# Patient Record
Sex: Male | Born: 1985
Health system: Southern US, Community
[De-identification: ages and names within clinical notes are randomized; demographics above are authoritative.]

## PROBLEM LIST (undated history)

## (undated) DIAGNOSIS — Z77098 Contact with and (suspected) exposure to other hazardous, chiefly nonmedicinal, chemicals: Secondary | ICD-10-CM

## (undated) DIAGNOSIS — J329 Chronic sinusitis, unspecified: Secondary | ICD-10-CM

## (undated) HISTORY — PX: OTHER SURGICAL HISTORY: SHX169

## (undated) HISTORY — DX: Chronic sinusitis, unspecified: J32.9

## (undated) HISTORY — PX: UPPER GI ENDOSCOPY: SHX6162

## (undated) HISTORY — DX: Contact with and (suspected) exposure to other hazardous, chiefly nonmedicinal, chemicals: Z77.098

---
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
## 2009-05-13 ENCOUNTER — Emergency Department: Payer: Self-pay | Admitting: Internal Medicine

## 2009-08-01 ENCOUNTER — Emergency Department: Payer: Self-pay | Admitting: Internal Medicine

## 2009-08-11 ENCOUNTER — Emergency Department: Payer: Self-pay | Admitting: Emergency Medicine

## 2009-10-10 ENCOUNTER — Emergency Department: Payer: Self-pay | Admitting: Emergency Medicine

## 2009-10-21 ENCOUNTER — Emergency Department: Payer: Self-pay | Admitting: Emergency Medicine

## 2014-02-18 ENCOUNTER — Ambulatory Visit: Payer: Self-pay | Admitting: Urology

## 2014-03-15 DIAGNOSIS — M545 Low back pain, unspecified: Secondary | ICD-10-CM | POA: Insufficient documentation

## 2015-07-31 IMAGING — CT CT ABD-PELV W/O CM
2 of 4 series · 16 of 46 positions shown, 18 images · non-contrast
Comparison: None.

CLINICAL DATA: Right-sided flank pain, dysuria

EXAM:
CT ABDOMEN AND PELVIS WITHOUT CONTRAST
TECHNIQUE: Multidetector CT imaging of the abdomen and pelvis was performed
following the standard protocol without intravenous contrast.

[Series 2: stone · axial · 0.66mm/px · z∈[-810,-450]mm · 13 of 80 slices shown, 15 images]
[im 4/80  soft-tissue]
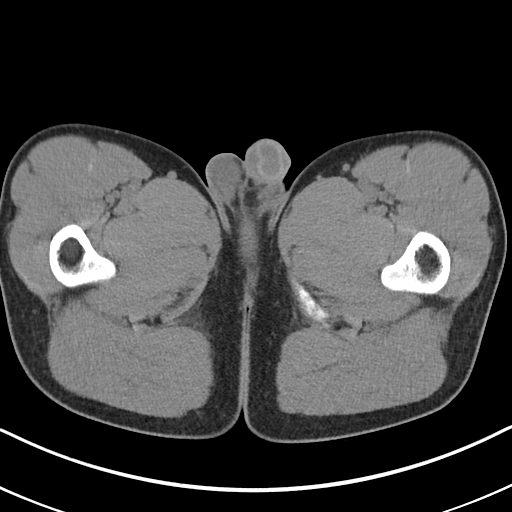
[im 4/80  bone]
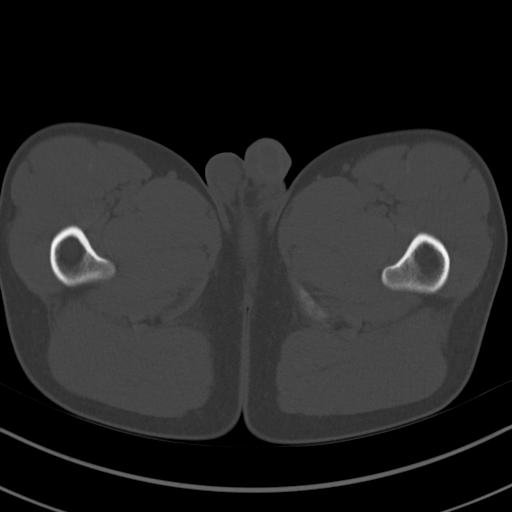
[im 10/80  soft-tissue]
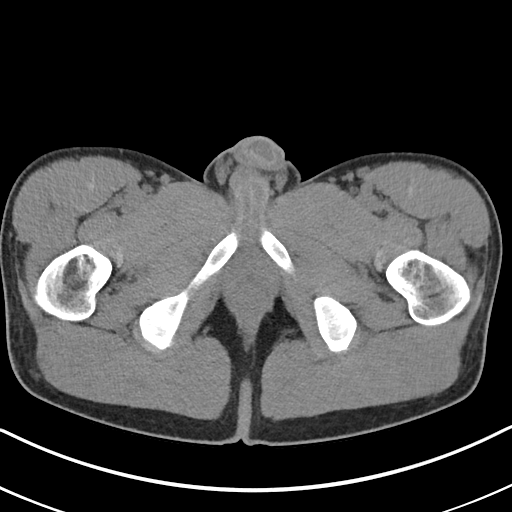
[im 16/80  soft-tissue]
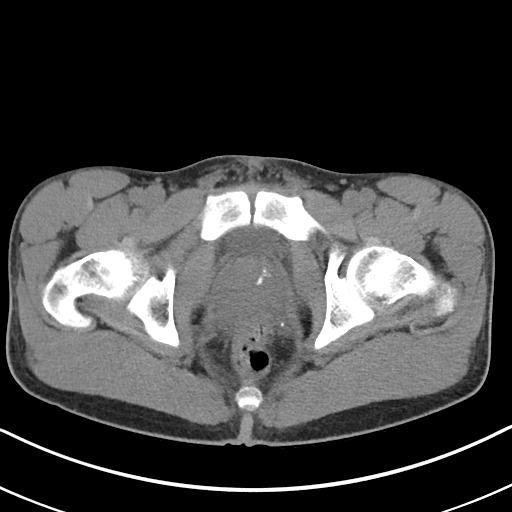
[im 23/80  soft-tissue]
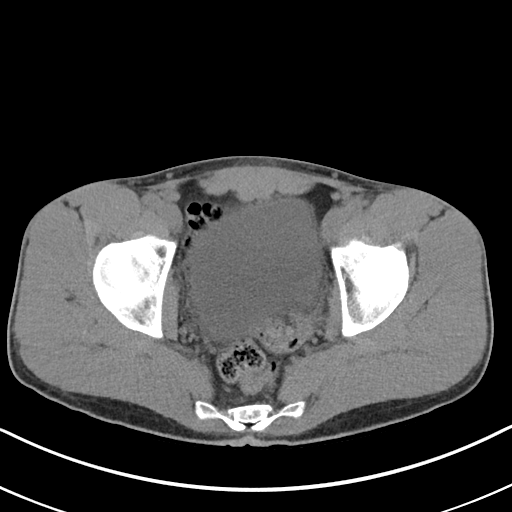
[im 29/80  soft-tissue]
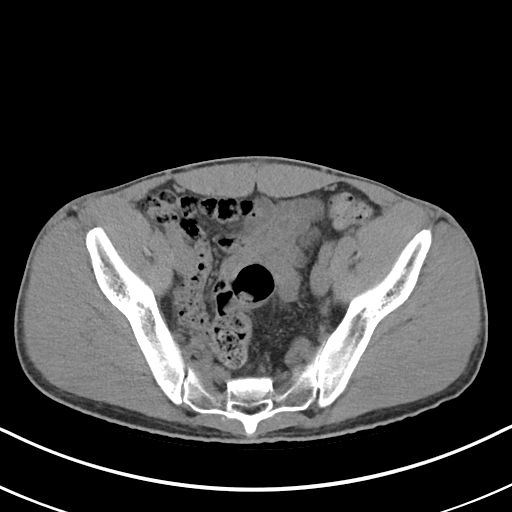
[im 35/80  soft-tissue]
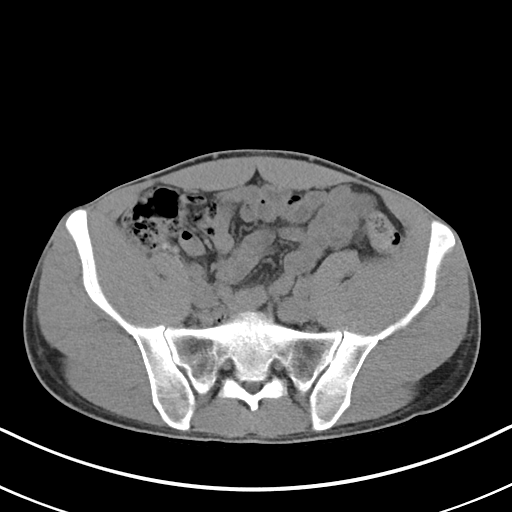
[im 42/80  soft-tissue]
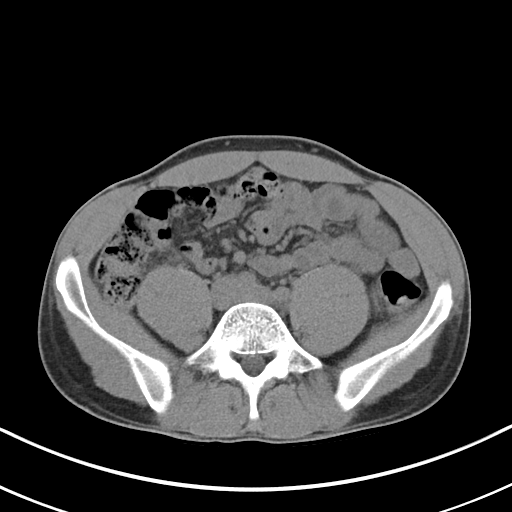
[im 45/80  soft-tissue]
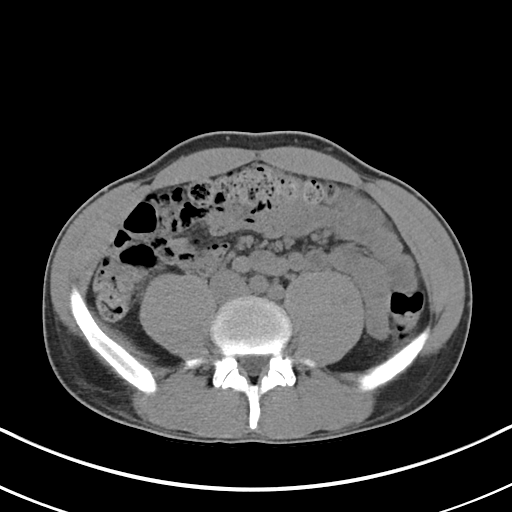
[im 51/80  soft-tissue]
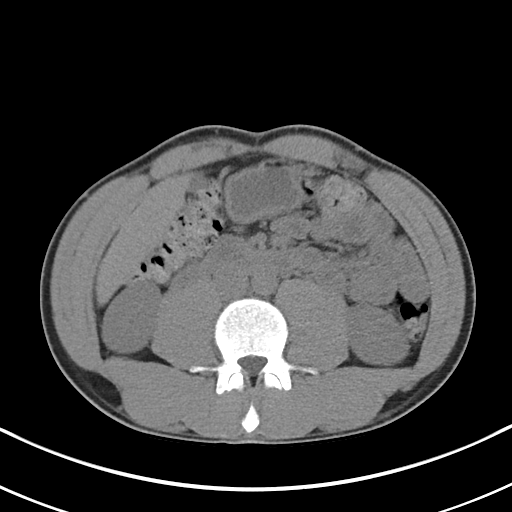
[im 51/80  bone]
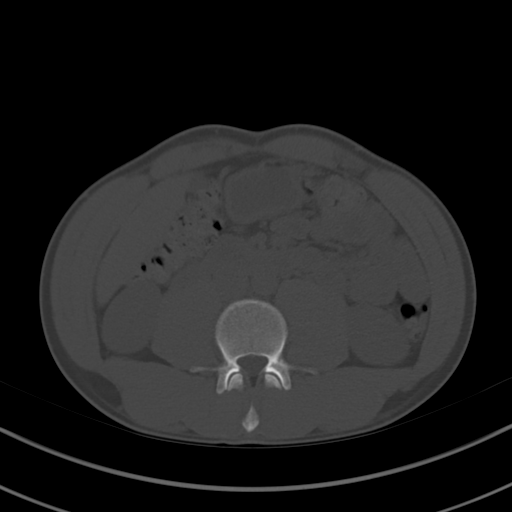
[im 57/80  soft-tissue]
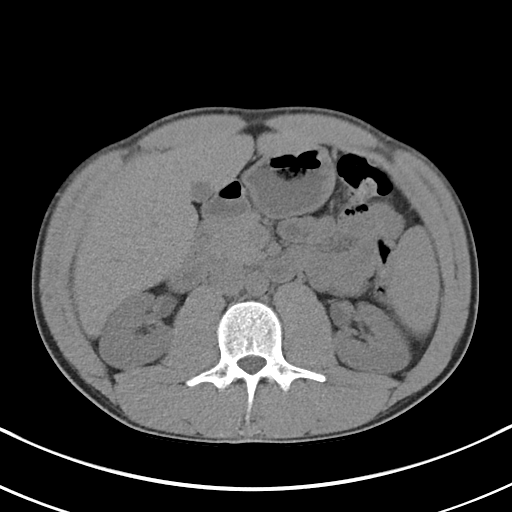
[im 64/80  soft-tissue]
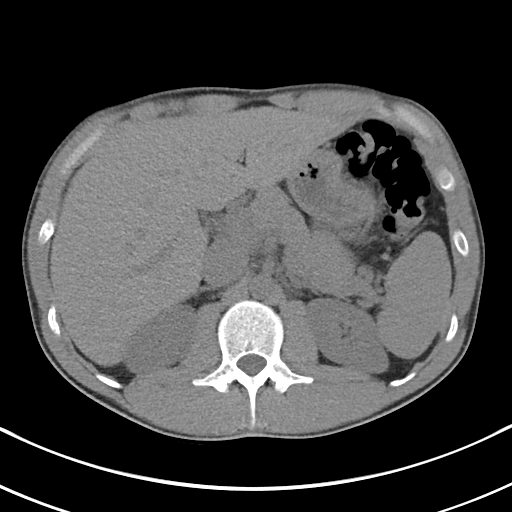
[im 70/80  soft-tissue]
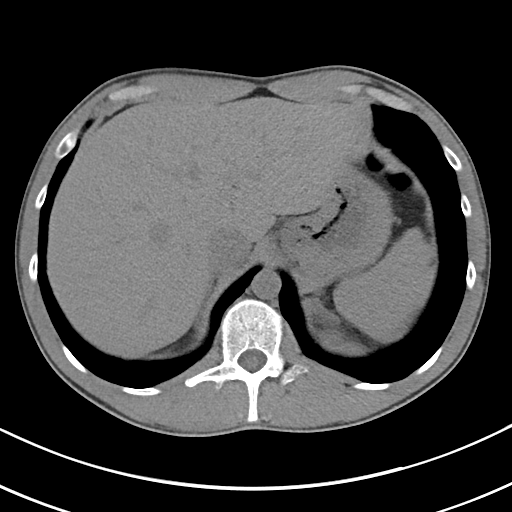
[im 76/80  soft-tissue]
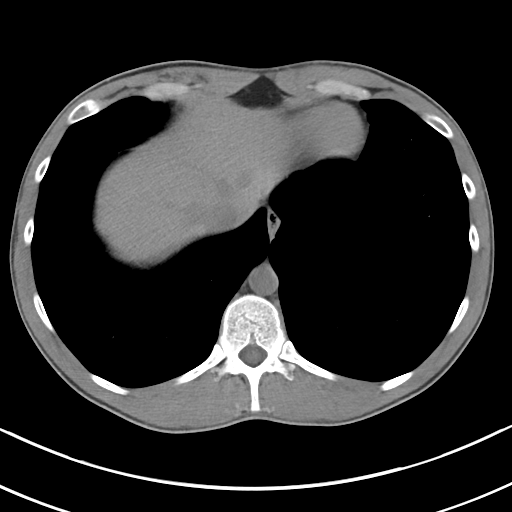

[Series 5: cor · coronal · 0.66mm/px · 3 of 134 slices shown]
[im 45/134  soft-tissue]
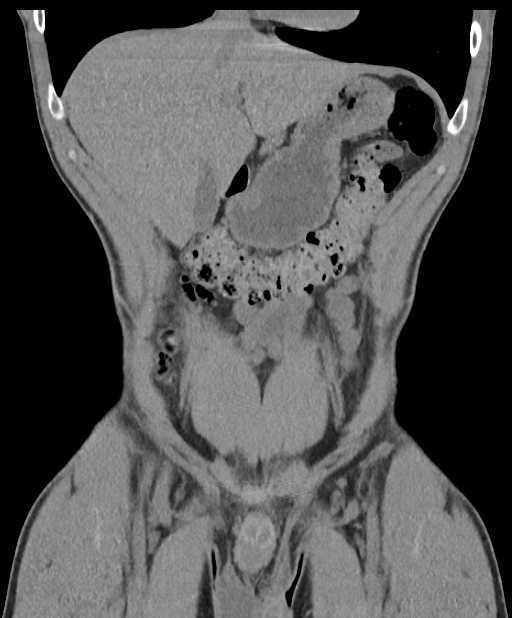
[im 60/134  soft-tissue]
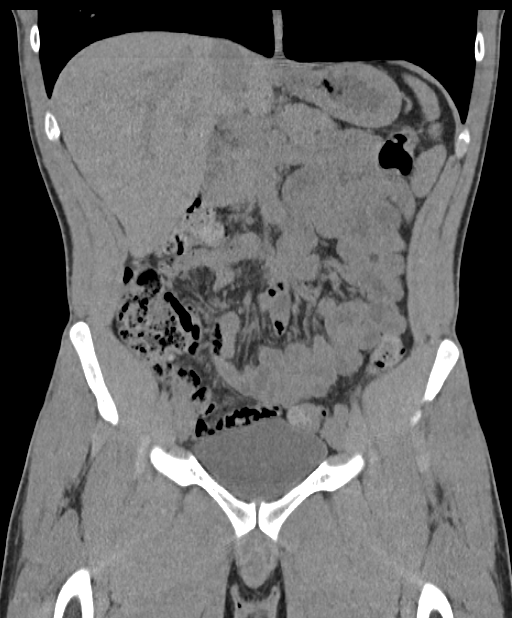
[im 74/134  soft-tissue]
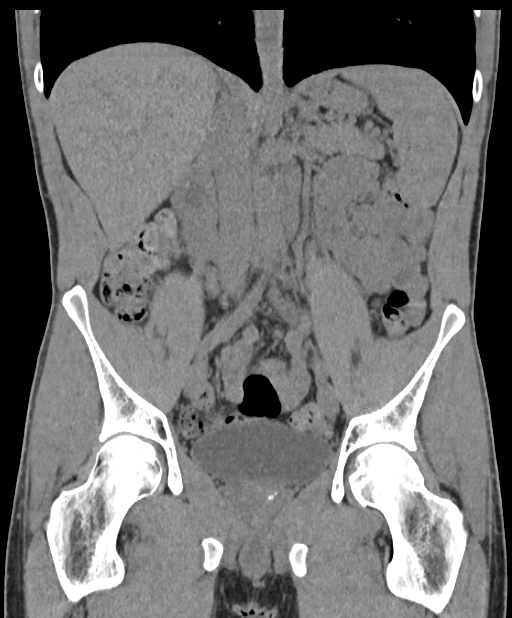

[16 of 46 positions shown; findings below may reference images not displayed]

FINDINGS: Too small to characterize hypodense lesions predominantly at the
dome of the liver measuring 5 mm and smaller, for example image 7,
are most typical for tiny cysts or biliary hamartomas. Liver is
otherwise unremarkable. One and 2 mm nonobstructing right renal
calculi are incidentally noted. Adrenal glands, spleen, pancreas,
and gallbladder are unremarkable.

Pelvic phleboliths are identified image 64. Allowing for partial non
visualization of the nondilated distal ureters, no definite ureteral
or bladder calculus is identified. No hydroureteronephrosis is seen.
No bowel wall thickening or focal segmental dilatation. The appendix
is normal. Bladder is physiologically distended but otherwise
unremarkable. No acute osseous abnormality.
IMPRESSION: Nonobstructing right renal calculi without hydroureteronephrosis or
CT evidence for ureteral or bladder calculus allowing for the
presence of probable pelvic phleboliths.

## 2019-06-27 ENCOUNTER — Telehealth: Payer: Self-pay | Admitting: Internal Medicine

## 2019-06-27 ENCOUNTER — Other Ambulatory Visit: Payer: Self-pay

## 2019-06-27 MED ORDER — CETIRIZINE HCL 10 MG PO TABS: 10.0000 mg | ORAL_TABLET | Freq: Every day | ORAL | 11 refills | Status: DC

## 2019-06-27 MED ORDER — FLUTICASONE PROPIONATE 50 MCG/ACT NA SUSP: 1.0000 | Freq: Every day | NASAL | 6 refills | Status: DC

## 2019-06-27 NOTE — Telephone Encounter (Signed)
Thanks

## 2019-06-27 NOTE — Telephone Encounter (Signed)
Put prescriptions in Epic.  AMD

## 2019-06-27 NOTE — Telephone Encounter (Signed)
Needs a refill rx for generic Flonaz and zertec. He wants rx because it is cheaper he said.  [facility] [place]

## 2019-09-26 ENCOUNTER — Other Ambulatory Visit: Payer: Self-pay

## 2019-09-26 ENCOUNTER — Ambulatory Visit: Payer: Self-pay | Admitting: Registered Nurse

## 2019-09-26 ENCOUNTER — Encounter: Payer: Self-pay | Admitting: Registered Nurse

## 2019-09-26 DIAGNOSIS — K649 Unspecified hemorrhoids: Secondary | ICD-10-CM | POA: Insufficient documentation

## 2019-09-26 DIAGNOSIS — R519 Headache, unspecified: Secondary | ICD-10-CM | POA: Insufficient documentation

## 2019-09-26 MED ORDER — WITCH HAZEL-GLYCERIN EX PADS: 1.0000 "application " | MEDICATED_PAD | CUTANEOUS | 1 refills | Status: AC | PRN

## 2019-09-26 MED ORDER — NITROGLYCERIN 0.4 % RE OINT: 1.0000 "application " | TOPICAL_OINTMENT | Freq: Two times a day (BID) | RECTAL | 0 refills | Status: DC | PRN

## 2019-09-26 MED ORDER — HYDROCORTISONE ACETATE 25 MG RE SUPP: 25.0000 mg | Freq: Four times a day (QID) | RECTAL | 0 refills | Status: DC | PRN

## 2019-09-26 MED ORDER — HYDROCORTISONE ACETATE 25 MG RE SUPP: 25.0000 mg | Freq: Four times a day (QID) | RECTAL | 0 refills | Status: AC | PRN

## 2019-09-26 NOTE — Patient Instructions (Signed)
How to Take a [employer] Bath A sitz bath is a warm water bath that may be used to care for your rectum, genital area, or the area between your rectum and genitals (perineum). For a sitz bath, the water only comes up to your hips and covers your buttocks. A sitz bath may done at home in a bathtub or with a portable sitz bath that fits over the toilet. Your health care provider may recommend a sitz bath to help:  Relieve pain and discomfort after delivering a baby.  Relieve pain and itching from hemorrhoids or anal fissures.  Relieve pain after certain surgeries.  Relax muscles that are sore or tight. How to take a sitz bath Take 3-4 sitz baths a day, or as many as told by your health care provider. Bathtub sitz bath To take a sitz bath in a bathtub: 1. Partially fill a bathtub with warm water. The water should be deep enough to cover your hips and buttocks when you are sitting in the tub. 2. If your health care provider told you to put medicine in the water, follow his or her instructions. 3. Sit in the water. 4. Open the tub drain a little, and leave it open during your bath. 5. Turn on the warm water again, enough to replace the water that is draining out. Keep the water running throughout your bath. This helps keep the water at the right level and the right temperature. 6. Soak in the water for 15-20 minutes, or as long as told by your health care provider. 7. When you are done, be careful when you stand up. You may feel dizzy. 8. After the sitz bath, pat yourself dry. Do not rub your skin to dry it.  Over-the-toilet sitz bath To take a sitz bath with an over-the-toilet basin: 1. Follow the manufacturer's instructions. 2. Fill the basin with warm water. 3. If your health care provider told you to put medicine in the water, follow his or her instructions. 4. Sit on the seat. Make sure the water covers your buttocks and perineum. 5. Soak in the water for 15-20 minutes, or as long as told by  your health care provider. 6. After the sitz bath, pat yourself dry. Do not rub your skin to dry it. 7. Clean and dry the basin between uses. 8. Discard the basin if it cracks, or according to the manufacturer's instructions. Contact a health care provider if:  Your symptoms get worse. Do not continue with sitz baths if your symptoms get worse.  You have new symptoms. If this happens, do not continue with sitz baths until you talk with your health care provider. Summary  A sitz bath is a warm water bath in which the water only comes up to your hips and covers your buttocks.  A sitz bath may help relieve itching, relieve pain, and relax muscles that are sore or tight in the lower part of your body, including your genital area.  Take 3-4 sitz baths a day, or as many as told by your health care provider. Soak in the water for 15-20 minutes.  Do not continue with sitz baths if your symptoms get worse. This information is not intended to replace advice given to you by your health care provider. Make sure you discuss any questions you have with your health care provider. Document Released: 07/17/2004 Document Revised: 10/27/2017 Document Reviewed: 10/27/2017 Elsevier Patient Education  2020 [employer] Hemorrhoids Hemorrhoids are swollen veins in and around the rectum  or anus. There are two types of hemorrhoids:  Internal hemorrhoids. These occur in the veins that are just inside the rectum. They may poke through to the outside and become irritated and painful.  External hemorrhoids. These occur in the veins that are outside the anus and can be felt as a painful swelling or hard lump near the anus. Most hemorrhoids do not cause serious problems, and they can be managed with home treatments such as diet and lifestyle changes. If home treatments do not help the symptoms, procedures can be done to shrink or remove the hemorrhoids. What are the causes? This condition is caused by increased  pressure in the anal area. This pressure may result from various things, including:  Constipation.  Straining to have a bowel movement.  Diarrhea.  Pregnancy.  Obesity.  Sitting for long periods of time.  Heavy lifting or other activity that causes you to strain.  Anal sex.  Riding a bike for a long period of time. What are the signs or symptoms? Symptoms of this condition include:  Pain.  Anal itching or irritation.  Rectal bleeding.  Leakage of stool (feces).  Anal swelling.  One or more lumps around the anus. How is this diagnosed? This condition can often be diagnosed through a visual exam. Other exams or tests may also be done, such as:  An exam that involves feeling the rectal area with a gloved hand (digital rectal exam).  An exam of the anal canal that is done using a small tube (anoscope).  A blood test, if you have lost a significant amount of blood.  A test to look inside the colon using a flexible tube with a camera on the end (sigmoidoscopy or colonoscopy). How is this treated? This condition can usually be treated at home. However, various procedures may be done if dietary changes, lifestyle changes, and other home treatments do not help your symptoms. These procedures can help make the hemorrhoids smaller or remove them completely. Some of these procedures involve surgery, and others do not. Common procedures include:  Rubber band ligation. Rubber bands are placed at the base of the hemorrhoids to cut off their blood supply.  Sclerotherapy. Medicine is injected into the hemorrhoids to shrink them.  Infrared coagulation. A type of light energy is used to get rid of the hemorrhoids.  Hemorrhoidectomy surgery. The hemorrhoids are surgically removed, and the veins that supply them are tied off.  Stapled hemorrhoidopexy surgery. The surgeon staples the base of the hemorrhoid to the rectal wall. Follow these instructions at home: Eating and drinking    Eat foods that have a lot of fiber in them, such as whole grains, beans, nuts, fruits, and vegetables.  Ask your health care provider about taking products that have added fiber (fiber supplements).  Reduce the amount of fat in your diet. You can do this by eating low-fat dairy products, eating less red meat, and avoiding processed foods.  Drink enough fluid to keep your urine pale yellow. Managing pain and swelling   Take warm sitz baths for 20 minutes, 3-4 times a day to ease pain and discomfort. You may do this in a bathtub or using a portable sitz bath that fits over the toilet.  If directed, apply ice to the affected area. Using ice packs between sitz baths may be helpful. ? Put ice in a plastic bag. ? Place a towel between your skin and the bag. ? Leave the ice on for 20 minutes, 2-3 times a  day. General instructions  Take over-the-counter and prescription medicines only as told by your health care provider.  Use medicated creams or suppositories as told.  Get regular exercise. Ask your health care provider how much and what kind of exercise is best for you. In general, you should do moderate exercise for at least 30 minutes on most days of the week (150 minutes each week). This can include activities such as walking, biking, or yoga.  Go to the bathroom when you have the urge to have a bowel movement. Do not wait.  Avoid straining to have bowel movements.  Keep the anal area dry and clean. Use wet toilet paper or moist towelettes after a bowel movement.  Do not sit on the toilet for long periods of time. This increases blood pooling and pain.  Keep all follow-up visits as told by your health care provider. This is important. Contact a health care provider if you have:  Increasing pain and swelling that are not controlled by treatment or medicine.  Difficulty having a bowel movement, or you are unable to have a bowel movement.  Pain or inflammation outside the area of  the hemorrhoids. Get help right away if you have:  Uncontrolled bleeding from your rectum. Summary  Hemorrhoids are swollen veins in and around the rectum or anus.  Most hemorrhoids can be managed with home treatments such as diet and lifestyle changes.  Taking warm sitz baths can help ease pain and discomfort.  In severe cases, procedures or surgery can be done to shrink or remove the hemorrhoids. This information is not intended to replace advice given to you by your health care provider. Make sure you discuss any questions you have with your health care provider. Document Released: 10/22/2000 Document Revised: 11/02/2018 Document Reviewed: 03/16/2018 Elsevier Patient Education  2020 [employer].

## 2019-09-26 NOTE — Progress Notes (Signed)
Presents for c/o of external hemorrhoid - noticed 2 days ago. Painful & itching.  Pains in lower intestine. No blood in the toilet or on toiled tissue. States has had one other time. Has been using OTC Preparation H without relief. Rides on a Gator at work & stays irritated. Will be off work the next two days. Has been on a vegan diet the past year & high fiber. Stress - grandmother passed away, dog with seizures & out of town class for work.  AMD

## 2019-09-26 NOTE — Progress Notes (Signed)
Subjective:    Patient ID: Johnny EchevariaJoshua A Mckinney, male    DOB: 08/25/1986, 33 y.o.   MRN: [id number]  33y/o caucasian male established patient here for hemorrhoid flare.  Last flare age 33 did sitz baths and preparation H and it went away.  Not this time.  Has been showering after every stool to clean area; denied blood.  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  Sitting way more than normal.  Feels like hemorrhoid in anal canal and sometimes stool leakage and skin chafing despite preparation H ointment.  Hasn't tried donut pillow or witch hazel pads.  Denied constipation or straining "I have a very healthy high fiber diet now compared to when I was 21"     Review of Systems  Constitutional: Positive for activity change. Negative for appetite change, chills, diaphoresis, fatigue and fever.  HENT: Negative for trouble swallowing and voice change.   Eyes: Negative for photophobia and visual disturbance.  Respiratory: Negative for cough, shortness of breath, wheezing and stridor.   Cardiovascular: Negative for chest pain.  Gastrointestinal: Positive for rectal pain. Negative for abdominal distention, abdominal pain, anal bleeding, blood in stool, constipation, diarrhea, nausea and vomiting.  Endocrine: Negative for cold intolerance and heat intolerance.  Genitourinary: Negative for difficulty urinating and dysuria.  Musculoskeletal: Negative for arthralgias, back pain, gait problem, joint swelling, myalgias, neck pain and neck stiffness.  Skin: Negative for wound.  Allergic/Immunologic: Negative for food allergies.  Neurological: Negative for dizziness, tremors, seizures, syncope, facial asymmetry, speech difficulty, weakness, light-headedness, numbness and headaches.  Psychiatric/Behavioral: Negative for agitation, confusion and sleep disturbance.       Objective:   Physical Exam Vitals signs  and nursing note reviewed. Exam conducted with a chaperone present.  Constitutional:      General: He is awake. He is not in acute distress.    Appearance: Normal appearance. He is well-developed, well-groomed and normal weight. He is not ill-appearing, toxic-appearing or diaphoretic.  HENT:     Head: Normocephalic and atraumatic.     Jaw: There is normal jaw occlusion.     Right Ear: Hearing and external ear normal.     Left Ear: Hearing and external ear normal.     Nose: Nose normal.     Mouth/Throat:     Lips: Pink. No lesions.     Mouth: Mucous membranes are moist.     Pharynx: Oropharynx is clear. Uvula midline.  Eyes:     General: Lids are normal. Vision grossly intact. Gaze aligned appropriately. No allergic shiner, visual field deficit or scleral icterus.       Right eye: No discharge.        Left eye: No discharge.     Extraocular Movements: Extraocular movements intact.     Conjunctiva/sclera: Conjunctivae normal.     Pupils: Pupils are equal, round, and reactive to light.  Neck:     Musculoskeletal: Normal range of motion and neck supple. Normal range of motion. No edema, erythema, neck rigidity, crepitus, injury, pain with movement or torticollis.     Trachea: Trachea and phonation normal.  Cardiovascular:     Rate and Rhythm: Normal rate and regular rhythm.     Pulses: Normal pulses.          Radial pulses are 2+ on the right side and 2+ on the left side.     Heart sounds: Normal heart  sounds.  Pulmonary:     Effort: Pulmonary effort is normal. No respiratory distress.     Breath sounds: Normal breath sounds and air entry. No stridor, decreased air movement or transmitted upper airway sounds. No decreased breath sounds, wheezing, rhonchi or rales.     Comments: Wearing cloth mask due to covid 19 pandemic; spoke full sentences without difficulty; no cough observed in exam room Abdominal:     General: Abdomen is flat. Bowel sounds are normal. There is no distension.      Palpations: Abdomen is soft. There is no mass.     Tenderness: There is no abdominal tenderness. There is no right CVA tenderness, left CVA tenderness, guarding or rebound. Negative signs include Murphy's sign.     Hernia: No hernia is present. There is no hernia in the umbilical area or ventral area.  Genitourinary:    Rectum: Tenderness and external hemorrhoid present. No mass. Normal anal tone.       Comments: Chaperone RN Elna Breslow offered gown patient refused and utilized drape/sheet exam lateral recumbant; hemorrhoid TTP from center of canal Musculoskeletal:        General: No swelling, tenderness, deformity or signs of injury.     Right shoulder: Normal.     Left shoulder: Normal.     Right elbow: Normal.    Left elbow: Normal.     Right hip: Normal.     Left hip: Normal.     Right knee: Normal.     Left knee: Normal.     Cervical back: Normal.     Thoracic back: Normal.     Lumbar back: Normal.     Right hand: Normal.     Left hand: Normal.     Right lower leg: No edema.     Left lower leg: No edema.  Lymphadenopathy:     Head:     Right side of head: No submental, submandibular, tonsillar, preauricular, posterior auricular or occipital adenopathy.     Left side of head: No submental, submandibular, tonsillar, preauricular, posterior auricular or occipital adenopathy.     Cervical: No cervical adenopathy.     Right cervical: No superficial cervical adenopathy.    Left cervical: No superficial cervical adenopathy.  Skin:    General: Skin is warm and dry.     Capillary Refill: Capillary refill takes less than 2 seconds.     Coloration: Skin is not ashen, cyanotic, jaundiced, mottled, pale or sallow.     Findings: No abrasion, abscess, acne, bruising, burn, ecchymosis, erythema, signs of injury, laceration, lesion, petechiae, rash or wound.     Nails: There is no clubbing.   Neurological:     General: No focal deficit present.     Mental Status: He is alert and  oriented to person, place, and time. Mental status is at baseline.     GCS: GCS eye subscore is 4. GCS verbal subscore is 5. GCS motor subscore is 6.     Cranial Nerves: Cranial nerves are intact. No cranial nerve deficit, dysarthria or facial asymmetry.     Sensory: Sensation is intact. No sensory deficit.     Motor: Motor function is intact. No weakness, tremor, atrophy, abnormal muscle tone or seizure activity.     Coordination: Coordination is intact. Coordination normal.     Gait: Gait is intact. Gait normal.     Comments: Gait sure and steady in hallway; on/off exam table and in/out of chair without difficulty  Psychiatric:  Attention and Perception: Attention and perception normal.        Mood and Affect: Mood and affect normal.        Speech: Speech normal.        Behavior: Behavior normal. Behavior is cooperative.        Thought Content: Thought content normal.        Cognition and Memory: Cognition and memory normal.        Judgment: Judgment normal.           Assessment & Plan:  A-hemorrhoid  P-Work excuse for the rest of shift today and return to full duty tomorrow.  Electronic Rx to his pharmacy of choice and printed Rx as patient states his pharmacy frequently has issues with the electronic Rx transmission.  Hydrocortisone suppository 25mg  po QID prn #24 RF0 and nitroglycerin ointment 0.4% apply BID prn #30gm RF0 and witch hazel pads prn daily for up to 14 days #1 RF0  Per up to date hemorrhoid treatment recommendation.  Discussed with patient can gently push hemorrhoid back internally with suppository.  Avoid itching/scratching hemorrhoid.  The following treatments usually help to relieve most cases of hemorrhoids: . High-fiber diet Eat more high-fiber foods, which will help prevent constipation. The best sources of fiber are whole-grain cereals, such as shredded wheat or cereals with bran. Fresh fruit and raw or cooked vegetables, especially asparagus, cabbage,  carrots, corn, and broccoli are other good sources of fiber. . Fluids Drink plenty of water. This helps to soften bowel movements so they are easier to pass. . Sitz baths and cold packs Sitting in lukewarm water 2 or 3 times a day for 15 minutes cleans the anal area and may relieve discomfort. (If the bath water is too hot, swelling around the anus will get worse.) Also, you might try putting a cloth-covered ice pack on the anus for 10 minutes, 4 times a day. . Medications For mild discomfort, your healthcare provider may prescribe a cream or ointment for the painful area. The cream may contain witch hazel, zinc oxide, or petroleum jelly. Your provider may also prescribe medicated suppositories to put inside the rectum. [employer] handout on sitz bath and hemorrhoids printed and given to patient.  Consider donut pillow if sitting for long periods. Follow up if bleeding, enlargement, fever/chills/body aches, purulent discharge or no improvement with plan of care x 48 hours.  Patient agreed with plan of care and verbalized understanding of instructions and had no further questions at this time. P2: Diet and fitness

## 2019-12-25 ENCOUNTER — Telehealth: Payer: Self-pay

## 2019-12-25 NOTE — Telephone Encounter (Signed)
Patient called and states he was seen on 09/26/19 for hemorrhoids and would like rx  changed to a different pharmacy due to cost. Patient called pharmacy himself and had rx transferred. No further action required.

## 2019-12-26 ENCOUNTER — Ambulatory Visit: Payer: Self-pay

## 2020-01-06 DIAGNOSIS — S93402A Sprain of unspecified ligament of left ankle, initial encounter: Secondary | ICD-10-CM | POA: Diagnosis not present

## 2020-01-06 DIAGNOSIS — M25572 Pain in left ankle and joints of left foot: Secondary | ICD-10-CM | POA: Diagnosis not present

## 2020-01-17 DIAGNOSIS — Z03818 Encounter for observation for suspected exposure to other biological agents ruled out: Secondary | ICD-10-CM | POA: Diagnosis not present

## 2020-01-17 DIAGNOSIS — K529 Noninfective gastroenteritis and colitis, unspecified: Secondary | ICD-10-CM | POA: Diagnosis not present

## 2020-08-17 ENCOUNTER — Other Ambulatory Visit: Payer: Self-pay

## 2020-08-17 ENCOUNTER — Ambulatory Visit
Admission: EM | Admit: 2020-08-17 | Discharge: 2020-08-17 | Disposition: A | Discharge status: Home / Self Care | Payer: Self-pay | Attending: Family Medicine | Admitting: Family Medicine

## 2020-08-17 ENCOUNTER — Encounter: Payer: Self-pay | Admitting: Emergency Medicine

## 2020-08-17 DIAGNOSIS — H6122 Impacted cerumen, left ear: Secondary | ICD-10-CM

## 2020-08-17 DIAGNOSIS — J329 Chronic sinusitis, unspecified: Secondary | ICD-10-CM

## 2020-08-17 MED ORDER — AMOXICILLIN-POT CLAVULANATE 875-125 MG PO TABS: 1.0000 | ORAL_TABLET | Freq: Two times a day (BID) | ORAL | 0 refills | Status: DC
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
## 2020-08-17 NOTE — ED Triage Notes (Signed)
Patient c/o nasal congestion, cough, and congestion that started 3 days ago.  Patient denies fevers.

## 2020-08-17 NOTE — Discharge Instructions (Signed)
Medication as prescribed.  Continue home zyrtec and flonase.  Take care  Dr. Adriana Simas

## 2020-11-04 ENCOUNTER — Ambulatory Visit
Admission: EM | Admit: 2020-11-04 | Discharge: 2020-11-04 | Disposition: A | Discharge status: Home / Self Care | Payer: HRSA Program | Attending: Emergency Medicine | Admitting: Emergency Medicine

## 2020-11-04 ENCOUNTER — Other Ambulatory Visit: Payer: Self-pay

## 2020-11-04 DIAGNOSIS — U071 COVID-19: Secondary | ICD-10-CM | POA: Diagnosis present

## 2020-11-04 LAB — RESP PANEL BY RT-PCR (FLU A&B, COVID) ARPGX2
Influenza A by PCR: NEGATIVE
Influenza B by PCR: NEGATIVE
SARS Coronavirus 2 by RT PCR: POSITIVE — AB

## 2020-11-04 MED ORDER — PROMETHAZINE-DM 6.25-15 MG/5ML PO SYRP: 5.0000 mL | ORAL_SOLUTION | Freq: Four times a day (QID) | ORAL | 0 refills | Status: DC | PRN

## 2020-11-04 MED ORDER — AEROCHAMBER MV MISC: 2 refills | Status: AC

## 2020-11-04 MED ORDER — BENZONATATE 100 MG PO CAPS: 200.0000 mg | ORAL_CAPSULE | Freq: Three times a day (TID) | ORAL | 0 refills | Status: DC

## 2020-11-04 MED ORDER — ALBUTEROL SULFATE HFA 108 (90 BASE) MCG/ACT IN AERS: 2.0000 | INHALATION_SPRAY | RESPIRATORY_TRACT | 0 refills | Status: DC | PRN
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
## 2020-11-04 NOTE — Discharge Instructions (Signed)
You have tested positive for COVID-19 today.  You need to quarantine for 10 days from when your symptoms started.  After the 10 days you can break quarantine if your symptoms have improved and you have not had a fever for 24 hours without taking Tylenol or ibuprofen.  Use the albuterol inhaler with a spacer, 2 puffs every 4-6 hours, as needed for shortness of breath and wheezing.  Take the [facility] during the day as needed for cough.  Take them with a small sip of water.  They may give you some numbness to the base of your tongue or a metallic taste in her mouth, this is normal.  Use the cough syrup for bedtime as this will make you drowsy.  I have referred you to the infusion clinic for monoclonal antibody infusions.  If they are able to give you an infusion they will reach out to you directly to schedule this.  If you develop worsening shortness of breath, especially shortness of breath at rest, you are unable to speak full sentences, or you develop bluing around your lips you need to go to the ER for evaluation.

## 2021-04-27 ENCOUNTER — Ambulatory Visit
Admission: EM | Admit: 2021-04-27 | Discharge: 2021-04-27 | Disposition: A | Discharge status: Home / Self Care | Payer: BC Managed Care – PPO | Attending: Emergency Medicine | Admitting: Emergency Medicine

## 2021-04-27 ENCOUNTER — Other Ambulatory Visit: Payer: Self-pay

## 2021-04-27 DIAGNOSIS — U071 COVID-19: Secondary | ICD-10-CM | POA: Diagnosis not present

## 2021-04-27 DIAGNOSIS — Z79899 Other long term (current) drug therapy: Secondary | ICD-10-CM | POA: Insufficient documentation

## 2021-04-27 DIAGNOSIS — Z7952 Long term (current) use of systemic steroids: Secondary | ICD-10-CM | POA: Diagnosis not present

## 2021-04-27 DIAGNOSIS — R059 Cough, unspecified: Secondary | ICD-10-CM | POA: Diagnosis not present

## 2021-04-27 DIAGNOSIS — J069 Acute upper respiratory infection, unspecified: Secondary | ICD-10-CM

## 2021-04-27 DIAGNOSIS — Z8709 Personal history of other diseases of the respiratory system: Secondary | ICD-10-CM | POA: Diagnosis not present

## 2021-04-27 DIAGNOSIS — R051 Acute cough: Secondary | ICD-10-CM | POA: Diagnosis present

## 2021-04-27 MED ORDER — PREDNISONE 50 MG PO TABS: ORAL_TABLET | ORAL | 0 refills | Status: DC

## 2021-04-27 MED ORDER — BENZONATATE 200 MG PO CAPS: 200.0000 mg | ORAL_CAPSULE | Freq: Two times a day (BID) | ORAL | 0 refills | Status: DC | PRN

## 2021-04-27 MED ORDER — GUAIFENESIN-CODEINE 100-10 MG/5ML PO SOLN: ORAL | 0 refills | Status: DC

## 2021-04-27 NOTE — Discharge Instructions (Addendum)
Your covid test will not be back until tomorrow. I will start you on some prednisone which will help with the cough and also sent some pills for the cough.  Use the inhaler every 4-6 hours for 5 days, then only as needed  thereafter

## 2021-04-27 NOTE — ED Triage Notes (Signed)
Patient states that he has been having a cough, nasal congestion, body aches, chills and shortness of breath x 2 days.
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
## 2021-04-28 LAB — SARS CORONAVIRUS 2 (TAT 6-24 HRS): SARS Coronavirus 2: POSITIVE — AB

## 2021-05-27 ENCOUNTER — Other Ambulatory Visit: Payer: Self-pay

## 2021-05-27 ENCOUNTER — Ambulatory Visit
Admission: EM | Admit: 2021-05-27 | Discharge: 2021-05-27 | Disposition: A | Discharge status: Home / Self Care | Payer: BC Managed Care – PPO | Attending: Sports Medicine | Admitting: Sports Medicine

## 2021-05-27 DIAGNOSIS — Z202 Contact with and (suspected) exposure to infections with a predominantly sexual mode of transmission: Secondary | ICD-10-CM | POA: Diagnosis present

## 2021-05-27 DIAGNOSIS — A6001 Herpesviral infection of penis: Secondary | ICD-10-CM | POA: Insufficient documentation

## 2021-05-27 DIAGNOSIS — N368 Other specified disorders of urethra: Secondary | ICD-10-CM | POA: Diagnosis present

## 2021-05-27 DIAGNOSIS — N5089 Other specified disorders of the male genital organs: Secondary | ICD-10-CM | POA: Diagnosis not present

## 2021-05-27 MED ORDER — VALACYCLOVIR HCL 1 G PO TABS: 1000.0000 mg | ORAL_TABLET | Freq: Two times a day (BID) | ORAL | 0 refills | Status: DC

## 2021-05-27 NOTE — ED Triage Notes (Signed)
Pt c/o small bump on the tip of his penis since Saturday night. Pt did recently have a new sexual partner. Pt denies any history of STDs. Pt states the area does not burn or hurt, only itches. Pt states the area is not draining or producing any discharge.

## 2021-05-27 NOTE — Discharge Instructions (Addendum)
As we discussed, we obtained a swab and will send it off for chlamydia, gonorrhea, and trichomonas.  Someone will contact you if you are positive and initiate the appropriate treatment. I will get a go ahead and treat you for genital herpes even though you only have 1 lesion.  It is Valtrex twice a day for 1 week.  This is indicated for your first exposure to genital herpes.  This I hope will put your mind at ease. If you have any further issues please go to your primary care provider, come back here, go to another urgent care, or go to the emergency room. Please flush your system with plenty of water. Please see educational handouts.

## 2021-05-29 LAB — CERVICOVAGINAL ANCILLARY ONLY
Chlamydia: NEGATIVE
Comment: NEGATIVE
Comment: NEGATIVE
Comment: NORMAL
Neisseria Gonorrhea: NEGATIVE
Trichomonas: NEGATIVE

## 2021-05-29 NOTE — ED Provider Notes (Signed)
[facility]    CSN: [id number] Arrival date & time: 05/27/21  1927      History   Chief Complaint Chief Complaint  Patient presents [id number]   skin lesion    HPI Johnny EchevariaJoshua A Mckinney is a 35 y.o. male.   35 year old male who presents for evaluation of concern about a potential STD.  He reports he was in a relationship for about 8 years.  He states that it was monogamous.  Recently broke up and that another male partner.  He has had sexual relations with her on 3 occasions.  The last was about a month ago.  He reports that he noticed a small bump on the tip of his penis on the left side several days ago and he is fairly concerned about it.  He is fairly anxious giving the history.  He said that the area is not overly painful but it is quite itchy.  On further history it appears as though he has some mild burning at the tip of the penis with urination but it actually feels better once he urinates.  He denies any significant discharge from the penis.  He denies any prior STDs.  He is concerned that he may have contracted herpes.  He denies any chest pain shortness of breath.  No fever shakes chills.  No abdominal or urinary symptoms.  No dysuria, hematuria, increased urinary frequency or urgency.  No red flag signs or symptoms elicited on history.   Past Medical History:  Diagnosis Date   Chlorine gas exposure    Sinusitis     Patient Active Problem List   Diagnosis Date Noted   Headache 09/26/2019   Hemorrhoids 09/26/2019   Low back pain 03/15/2014    Past Surgical History:  Procedure Laterality Date   UPPER GI ENDOSCOPY     wisdom teex removed Bilateral        Home Medications    Prior to Admission medications   Medication Sig Start Date End Date Taking? Authorizing Provider  albuterol (VENTOLIN HFA) 108 (90 Base) MCG/ACT inhaler Inhale 2 puffs into the lungs every 4 (four) hours as needed for wheezing or shortness of breath. 11/04/20  Yes Becky Augustayan, Jeremy, NP   cetirizine (ZYRTEC) 10 MG tablet Take 1 tablet (10 mg total) by mouth daily. 06/27/19  Yes Jamelle HaringHendrickson, Clifford D, MD  fluticasone (FLONASE) 50 MCG/ACT nasal spray Place 1 spray into both nostrils daily. 06/27/19  Yes Jamelle HaringHendrickson, Clifford D, MD  valACYclovir (VALTREX) 1000 MG tablet Take 1 tablet (1,000 mg total) by mouth 2 (two) times daily. 05/27/21  Yes Delton SeeBarnes, Maggi Hershkowitz, MD  benzonatate (TESSALON) 200 MG capsule Take 1 capsule (200 mg total) by mouth 2 (two) times daily as needed for cough. 04/27/21   Rodriguez-Southworth, Nettie ElmSylvia, PA-C  guaiFENesin-codeine 100-10 MG/5ML syrup 1-2 tsp qhs prn cough 04/27/21   Rodriguez-Southworth, Nettie ElmSylvia, PA-C  predniSONE (DELTASONE) 50 MG tablet One qd x 5 days 04/27/21   Rodriguez-Southworth, Nettie ElmSylvia, PA-C  Spacer/Aero-Holding Chambers (AEROCHAMBER MV) inhaler Use as instructed 11/04/20   Becky Augustayan, Jeremy, NP    Family History Family History  Problem Relation Age of Onset   COPD Father    Emphysema Father     Social History Social History   Tobacco Use   Smoking status: Never   Smokeless tobacco: Never  Vaping Use   Vaping Use: Never used  Substance Use Topics   Alcohol use: Not Currently   Drug use: Never     Allergies   Patient  has no known allergies.   Review of Systems Review of Systems  Constitutional:  Negative for activity change, appetite change, chills, diaphoresis, fatigue and fever.  HENT:  Negative for congestion, ear pain, postnasal drip, rhinorrhea, sinus pressure, sinus pain, sneezing and sore throat.   Eyes:  Negative for pain.  Respiratory:  Negative for cough, chest tightness and shortness of breath.   Cardiovascular:  Negative for chest pain and palpitations.  Gastrointestinal:  Negative for abdominal pain, diarrhea, nausea and vomiting.  Genitourinary:  Negative for dysuria, flank pain, frequency, hematuria, penile discharge, penile swelling, scrotal swelling, testicular pain and urgency.  Musculoskeletal:  Negative for back  pain, myalgias and neck pain.  Skin:  Negative for color change, pallor, rash and wound.  Neurological:  Negative for dizziness, light-headedness and headaches.  All other systems reviewed and are negative.   Physical Exam Triage Vital Signs ED Triage Vitals  Enc Vitals Group     BP 05/27/21 2009 (!) 142/104     Pulse Rate 05/27/21 2009 79     Resp 05/27/21 2009 18     Temp 05/27/21 2009 98.5 F (36.9 C)     Temp Source 05/27/21 2009 Oral     SpO2 05/27/21 2009 99 %     Weight 05/27/21 2007 165 lb (74.8 kg)     Height 05/27/21 2007 5\' 9"  (1.753 m)     Head Circumference --      Peak Flow --      Pain Score 05/27/21 2007 0     Pain Loc --      Pain Edu? --      Excl. in GC? --    No data found.  Updated Vital Signs BP (!) 142/104 (BP Location: Left Arm)   Pulse 79   Temp 98.5 F (36.9 C) (Oral)   Resp 18   Ht 5\' 9"  (1.753 m)   Wt 74.8 kg   SpO2 99%   BMI 24.37 kg/m   Visual Acuity Right Eye Distance:   Left Eye Distance:   Bilateral Distance:    Right Eye Near:   Left Eye Near:    Bilateral Near:     Physical Exam Vitals reviewed.  Constitutional:      General: He is not in acute distress.    Appearance: Normal appearance. He is not ill-appearing, toxic-appearing or diaphoretic.  HENT:     Head: Normocephalic and atraumatic.     Nose: Nose normal.     Mouth/Throat:     Mouth: Mucous membranes are moist.  Eyes:     Conjunctiva/sclera: Conjunctivae normal.     Pupils: Pupils are equal, round, and reactive to light.  Cardiovascular:     Rate and Rhythm: Normal rate and regular rhythm.     Pulses: Normal pulses.     Heart sounds: Normal heart sounds. No murmur heard.   No friction rub. No gallop.  Pulmonary:     Effort: Pulmonary effort is normal.     Breath sounds: Normal breath sounds. No stridor. No wheezing, rhonchi or rales.  Genitourinary:    Pubic Area: No rash.      Penis: Circumcised.      Testes: Normal.     Tanner stage (genital): 5.      Comments: Very tiny lesion/bump on the tip of the penis on the right side.  No drainage.  No active infection.  No erythema.  May be a tiny vesicular lesion consistent with HSV versus just a normal  variant. Musculoskeletal:     Cervical back: Normal range of motion and neck supple.  Skin:    General: Skin is warm and dry.     Capillary Refill: Capillary refill takes less than 2 seconds.  Neurological:     General: No focal deficit present.     Mental Status: He is alert and oriented to person, place, and time.     UC Treatments / Results  Labs (all labs ordered are listed, but only abnormal results are displayed) Labs Reviewed  CERVICOVAGINAL ANCILLARY ONLY    EKG   Radiology No results found.  Procedures Procedures (including critical care time)  Medications Ordered in UC Medications - No data to display  Initial Impression / Assessment and Plan / UC Course  I have reviewed the triage vital signs and the nursing notes.  Pertinent labs & imaging results that were available during my care of the patient were reviewed by me and considered in my medical decision making (see chart for details).  Clinical impression: 35 year old male with concerns about possible STD.  He has some mild irritation of the urethral meatus with urination but no significant symptoms of a UTI.  There is a small lesion on the glans penis that may be consistent with a solitary HSV lesion although it looks more consistent with a normal variant.  Treatment plan: 1.  The findings and treatment plan were discussed in detail with the patient.  Patient was in agreement. 2.  Given that he does not have any significant urinary symptoms we chose not to do a UA. 3.  Given his sexual encounter and his significant anxiety we will go ahead and get a swab for chlamydia, gonorrhea, and trichomonas.  Someone will contact him with a positive result.  He can follow along in MyChart. 4.  Given his concern about that tiny  lesion on the glans penis I have made the clinical decision to go ahead and treat him.  Per the standard of care given that he has never had an STD we will go ahead and treat him with Valtrex 1000 mg twice a day for a week as this is his first exposure. 5.  Educational handouts provided. 6.  Asked him to flush her system with plenty of water to assist with the irritation. 7.  If he has persistent symptoms of asked him to go to his PCP, go to another urgent care, come back here, or go to the ER. 8.  He was stable upon discharge and will follow-up here as needed.    Final Clinical Impressions(s) / UC Diagnoses   Final diagnoses:  Genital lesion, male  Herpes simplex infection of penis  Irritation of urethral meatus  Possible exposure to STD     Discharge Instructions      As we discussed, we obtained a swab and will send it off for chlamydia, gonorrhea, and trichomonas.  Someone will contact you if you are positive and initiate the appropriate treatment. I will get a go ahead and treat you for genital herpes even though you only have 1 lesion.  It is Valtrex twice a day for 1 week.  This is indicated for your first exposure to genital herpes.  This I hope will put your mind at ease. If you have any further issues please go to your primary care provider, come back here, go to another urgent care, or go to the emergency room. Please flush your system with plenty of water. Please see educational handouts.  ED Prescriptions     Medication Sig Dispense Auth. Provider   valACYclovir (VALTREX) 1000 MG tablet Take 1 tablet (1,000 mg total) by mouth 2 (two) times daily. 14 tablet Delton See, MD      PDMP not reviewed this encounter.   Delton See, MD 05/30/21 [id number]

## 2021-06-01 ENCOUNTER — Ambulatory Visit
Admission: EM | Admit: 2021-06-01 | Discharge: 2021-06-01 | Disposition: A | Discharge status: Home / Self Care | Payer: BC Managed Care – PPO | Attending: Family Medicine | Admitting: Family Medicine

## 2021-06-01 ENCOUNTER — Other Ambulatory Visit: Payer: Self-pay

## 2021-06-01 ENCOUNTER — Encounter: Payer: Self-pay | Admitting: Emergency Medicine

## 2021-06-01 DIAGNOSIS — N489 Disorder of penis, unspecified: Secondary | ICD-10-CM | POA: Diagnosis not present

## 2021-06-01 LAB — HIV ANTIBODY (ROUTINE TESTING W REFLEX): HIV Screen 4th Generation wRfx: NONREACTIVE

## 2021-06-01 NOTE — ED Triage Notes (Addendum)
Pt was seen here on 05/27/21 for bump on tip of penis. He was prescribed Valtrex, but has not taken it, because he wants to know what it is.  He was negative for Gonorrhea, Chlamydia and Trichomonas. Today he would like to be tested for "everything else". Denies discharge or genital pain.

## 2021-06-01 NOTE — Discharge Instructions (Addendum)
Results should be available in the next 2 days.  Check your Mychart account.  I have placed a referral as you requested.

## 2021-06-01 NOTE — ED Provider Notes (Signed)
[facility]    CSN: [id number] Arrival date & time: 06/01/21  0951      History   Chief Complaint Chief Complaint  Patient presents with   SEXUALLY TRANSMITTED DISEASE    HPI 35 year old male presents with concern about a penile lesion.  Patient recently seen on 7/20.  See document encounter.  Patient had a penile lesion at the time.  Lesion is located on the head of the penis.  HSV testing was not done as it was after hours and it could not be obtained.  He was placed on Valtrex.  Patient states that the area has drastically improved.  He has not taken the prescribed medication as he wanted to be tested before he took the medication.  Patient reports some "itching" at the urethra which improves with urination.  Patient states that he also has urinary frequency and feels like he urinates more than he should.  He would like to discuss a referral to urology today.  Past Medical History:  Diagnosis Date   Chlorine gas exposure    Sinusitis     Patient Active Problem List   Diagnosis Date Noted   Headache 09/26/2019   Hemorrhoids 09/26/2019   Low back pain 03/15/2014    Past Surgical History:  Procedure Laterality Date   UPPER GI ENDOSCOPY     wisdom teex removed Bilateral        Home Medications    Prior to Admission medications   Medication Sig Start Date End Date Taking? Authorizing Provider  albuterol (VENTOLIN HFA) 108 (90 Base) MCG/ACT inhaler Inhale 2 puffs into the lungs every 4 (four) hours as needed for wheezing or shortness of breath. 11/04/20   Becky Augusta, NP  benzonatate (TESSALON) 200 MG capsule Take 1 capsule (200 mg total) by mouth 2 (two) times daily as needed for cough. 04/27/21   Rodriguez-Southworth, Nettie Elm, PA-C  cetirizine (ZYRTEC) 10 MG tablet Take 1 tablet (10 mg total) by mouth daily. 06/27/19   Jamelle Haring, MD  fluticasone (FLONASE) 50 MCG/ACT nasal spray Place 1 spray into both nostrils daily. 06/27/19   Jamelle Haring, MD  guaiFENesin-codeine 100-10 MG/5ML syrup 1-2 tsp qhs prn cough 04/27/21   Rodriguez-Southworth, Nettie Elm, PA-C  predniSONE (DELTASONE) 50 MG tablet One qd x 5 days 04/27/21   Rodriguez-Southworth, Nettie Elm, PA-C  Spacer/Aero-Holding Chambers (AEROCHAMBER MV) inhaler Use as instructed 11/04/20   Becky Augusta, NP  valACYclovir (VALTREX) 1000 MG tablet Take 1 tablet (1,000 mg total) by mouth 2 (two) times daily. 05/27/21   Delton See, MD    Family History Family History  Problem Relation Age of Onset   COPD Father    Emphysema Father     Social History Social History   Tobacco Use   Smoking status: Never   Smokeless tobacco: Never  Vaping Use   Vaping Use: Never used  Substance Use Topics   Alcohol use: Not Currently   Drug use: Never     Allergies   Patient has no known allergies.   Review of Systems Review of Systems  Constitutional: Negative.   Genitourinary:  Positive for frequency and genital sores. Negative for penile discharge.   Physical Exam Triage Vital Signs ED Triage Vitals  Enc Vitals Group     BP 06/01/21 1019 (!) 133/96     Pulse Rate 06/01/21 1019 73     Resp 06/01/21 1019 18     Temp 06/01/21 1019 98.1 F (36.7 C)  Temp Source 06/01/21 1019 Oral     SpO2 06/01/21 1019 100 %     Weight --      Height --      Head Circumference --      Peak Flow --      Pain Score 06/01/21 1015 0     Pain Loc --      Pain Edu? --      Excl. in GC? --    Updated Vital Signs BP (!) 133/96 (BP Location: Left Arm)   Pulse 73   Temp 98.1 F (36.7 C) (Oral)   Resp 18   SpO2 100%   Visual Acuity Right Eye Distance:   Left Eye Distance:   Bilateral Distance:    Right Eye Near:   Left Eye Near:    Bilateral Near:     Physical Exam Vitals and nursing note reviewed.  Constitutional:      General: He is not in acute distress.    Appearance: Normal appearance. He is not ill-appearing.  Eyes:     General:        Right eye: No discharge.         Left eye: No discharge.     Conjunctiva/sclera: Conjunctivae normal.  Pulmonary:     Effort: Pulmonary effort is normal. No respiratory distress.  Genitourinary:    Comments: No penile discharge.  Patient has a small pinpoint papule on the head of the penis.  This is not an ulcerative lesion.  Does not appear to be vesicular either. Neurological:     Mental Status: He is alert.  Psychiatric:        Behavior: Behavior normal.     Comments: Anxious.     UC Treatments / Results  Labs (all labs ordered are listed, but only abnormal results are displayed) Labs Reviewed  HSV CULTURE AND TYPING  RPR  HIV ANTIBODY (ROUTINE TESTING W REFLEX)    EKG   Radiology No results found.  Procedures Procedures (including critical care time)  Medications Ordered in UC Medications - No data to display  Initial Impression / Assessment and Plan / UC Course  I have reviewed the triage vital signs and the nursing notes.  Pertinent labs & imaging results that were available during my care of the patient were reviewed by me and considered in my medical decision making (see chart for details).    35 year old male presents with a small lesion on the head of his penis.  Recent lab results reviewed. This appears to be resolving.  Clinically, does not appear to be secondary to syphilis or HSV. Awaiting HSV, HIV, & RPR results. Referral to urology placed (per patient request) for urinary frequency.  Final Clinical Impressions(s) / UC Diagnoses   Final diagnoses:  Penile lesion     Discharge Instructions      Results should be available in the next 2 days.  Check your Mychart account.  I have placed a referral as you requested.   ED Prescriptions   None    PDMP not reviewed this encounter.   Tommie Sams, DO 06/01/21 1213

## 2021-06-02 LAB — RPR: RPR Ser Ql: NONREACTIVE

## 2021-06-03 LAB — HSV CULTURE AND TYPING

## 2021-06-09 ENCOUNTER — Ambulatory Visit: Payer: Self-pay | Admitting: Urology

## 2021-06-16 ENCOUNTER — Ambulatory Visit (INDEPENDENT_AMBULATORY_CARE_PROVIDER_SITE_OTHER): Payer: BC Managed Care – PPO | Admitting: Urology

## 2021-06-16 ENCOUNTER — Other Ambulatory Visit: Payer: Self-pay

## 2021-06-16 ENCOUNTER — Other Ambulatory Visit: Payer: Self-pay | Admitting: *Deleted

## 2021-06-16 ENCOUNTER — Other Ambulatory Visit
Admission: RE | Admit: 2021-06-16 | Discharge: 2021-06-16 | Disposition: A | Discharge status: Home / Self Care | Payer: BC Managed Care – PPO | Attending: Urology | Admitting: Urology

## 2021-06-16 ENCOUNTER — Encounter: Payer: Self-pay | Admitting: Urology

## 2021-06-16 VITALS — BP 133/86 | HR 72 | Ht 69.0 in | Wt 162.0 lb

## 2021-06-16 DIAGNOSIS — R350 Frequency of micturition: Secondary | ICD-10-CM

## 2021-06-16 DIAGNOSIS — R399 Unspecified symptoms and signs involving the genitourinary system: Secondary | ICD-10-CM

## 2021-06-16 DIAGNOSIS — N489 Disorder of penis, unspecified: Secondary | ICD-10-CM

## 2021-06-16 LAB — BLADDER SCAN AMB NON-IMAGING

## 2021-06-16 LAB — URINALYSIS, COMPLETE (UACMP) WITH MICROSCOPIC
Bilirubin Urine: NEGATIVE
Glucose, UA: NEGATIVE mg/dL
Hgb urine dipstick: NEGATIVE
Ketones, ur: NEGATIVE mg/dL
Leukocytes,Ua: NEGATIVE
Nitrite: NEGATIVE
Protein, ur: NEGATIVE mg/dL
Specific Gravity, Urine: 1.02 (ref 1.005–1.030)
pH: 6.5 (ref 5.0–8.0)

## 2021-06-16 NOTE — Progress Notes (Signed)
   06/16/21 12:29 PM   Johnny Mckinney 1985-12-04 [id number]  CC: Lower urinary tract symptoms, possible penile lesion  HPI: 34 year old male with anxiety who has had multiple ER visits in the last 3 weeks for a possible penile lesion as well as urinary frequency.  He reports his urinary frequency has been present at least 5 years, as well as a sense of incomplete emptying.  He denies any dysuria or gross hematuria.  He also saw a small lesion on the glans that he felt could be an STD, and underwent extensive testing for all STDs which were all negative.  Today his primary complaint is urinary frequency.  He drinks primarily water during the day.  He denies any significant nocturia.  No prior UTIs or history of retention.  Notably, he has a history of a penile piercing of the glans that was placed about 2 years ago, and has since been removed.  He is urinary symptoms were present prior to the piercing.  STD labs reviewed in epic.  Urinalysis today is benign with 0-5 squamous cells, 0-5 WBCs, 0-5 RBCs, few bacteria.   PMH: Past Medical History:  Diagnosis Date   Chlorine gas exposure    Sinusitis     Surgical History: Past Surgical History:  Procedure Laterality Date   UPPER GI ENDOSCOPY     wisdom teex removed Bilateral     Family History: Family History  Problem Relation Age of Onset   COPD Father    Emphysema Father     Social History:  reports that he has never smoked. He has never used smokeless tobacco. He reports previous alcohol use. He reports that he does not use drugs.  Physical Exam: BP 133/86   Pulse 72   Ht 5\' 9"  (1.753 m)   Wt 162 lb (73.5 kg)   BMI 23.92 kg/m    Constitutional:  Alert and oriented, No acute distress. Cardiovascular: No clubbing, cyanosis, or edema. Respiratory: Normal respiratory effort, no increased work of breathing. GI: Abdomen is soft, nontender, nondistended, no abdominal masses GU: Circumcised phallus with patent meatus, no  lesions, testicles 20 cc and descended bilaterally, no penile lesions.  Laboratory Data: Reviewed, see HPI in epic  Pertinent Imaging: None to review  Assessment & Plan:   35 year old male with 5 years of urinary frequency of unclear etiology.  He also has a benign exam today with no penile lesions, and recent STD testing was all completely negative.  We discussed possible etiologies at length including anxiety/stress, urethral stricture, pelvic floor dysfunction, or idiopathic.  I recommended considering cystoscopy with his history of a penile piercing and urinary symptoms to rule out any urethral stricture disease.  He is unsure if he would like to proceed at this time.  We also discussed the role of pelvic floor physical therapy, and he is interested in a referral to them.  Schedule cystoscopy if patient desires Referral placed to pelvic floor physical therapy Behavioral strategies discussed    36, MD 06/16/2021  [facility] [address] [place], [place] [phone]

## 2021-06-16 NOTE — Patient Instructions (Signed)
Cystoscopy Cystoscopy is a procedure that is used to help diagnose and sometimes treat conditions that affect the lower urinary tract. The lower urinary tract includes the bladder and the urethra. The urethra is the tube that drains urine from the bladder. Cystoscopy is done using a thin, tube-shaped instrument with a light and camera at the end (cystoscope). The cystoscope may be hard or flexible, depending on the goal of theprocedure. The cystoscope is inserted through the urethra, into the bladder. Cystoscopy may be recommended if you have: Urinary tract infections that keep coming back. Blood in the urine (hematuria). An inability to control when you urinate (urinary incontinence) or an overactive bladder. Unusual cells found in a urine sample. A blockage in the urethra, such as a urinary stone. Painful urination. An abnormality in the bladder found during an intravenous pyelogram (IVP) or CT scan. Cystoscopy may also be done to remove a sample of tissue to be examined under a microscope (biopsy). Tell a health care provider about: Any allergies you have. All medicines you are taking, including vitamins, herbs, eye drops, creams, and over-the-counter medicines. Any problems you or family members have had with anesthetic medicines. Any blood disorders you have. Any surgeries you have had. Any medical conditions you have. Whether you are pregnant or may be pregnant. What are the risks? Generally, this is a safe procedure. However, problems may occur, including: Infection. Bleeding. Allergic reactions to medicines. Damage to other structures or organs. What happens before the procedure? Medicines Ask your health care provider about: Changing or stopping your regular medicines. This is especially important if you are taking diabetes medicines or blood thinners. Taking medicines such as aspirin and ibuprofen. These medicines can thin your blood. Do not take these medicines unless your  health care provider tells you to take them. Taking over-the-counter medicines, vitamins, herbs, and supplements. Tests You may have an exam or testing, such as: X-rays of the bladder, urethra, or kidneys. CT scan of the abdomen or pelvis. Urine tests to check for signs of infection. General instructions Follow instructions from your health care provider about eating or drinking restrictions. Ask your health care provider what steps will be taken to help prevent infection. These steps may include: Washing skin with a germ-killing soap. Taking antibiotic medicine. Plan to have a responsible adult take you home from the hospital or clinic. What happens during the procedure?  You will be given one or more of the following: A medicine to help you relax (sedative). A medicine to numb the area (local anesthetic). The area around the opening of your urethra will be cleaned. The cystoscope will be passed through your urethra into your bladder. Germ-free (sterile) fluid will flow through the cystoscope to fill your bladder. The fluid will stretch your bladder so that your health care provider can clearly examine your bladder walls. Your doctor will look at the urethra and bladder. Your doctor may take a biopsy or remove stones. The cystoscope will be removed, and your bladder will be emptied. The procedure may vary among health care providers and hospitals. What can I expect after the procedure? After the procedure, it is common to have: Some soreness or pain in your abdomen and urethra. Urinary symptoms. These include: Mild pain or burning when you urinate. Pain should stop within a few minutes after you urinate. This may last for up to 1 week. A small amount of blood in your urine for several days. Feeling like you need to urinate but producing only a   small amount of urine. Follow these instructions at home: Medicines Take over-the-counter and prescription medicines only as told by your  health care provider. If you were prescribed an antibiotic medicine, take it as told by your health care provider. Do not stop taking the antibiotic even if you start to feel better. General instructions Return to your normal activities as told by your health care provider. Ask your health care provider what activities are safe for you. If you were given a sedative during the procedure, it can affect you for several hours. Do not drive or operate machinery until your health care provider says that it is safe. Watch for any blood in your urine. If the amount of blood in your urine increases, call your health care provider. Follow instructions from your health care provider about eating or drinking restrictions. If a tissue sample was removed for testing (biopsy) during your procedure, it is up to you to get your test results. Ask your health care provider, or the department that is doing the test, when your results will be ready. Drink enough fluid to keep your urine pale yellow. Keep all follow-up visits. This is important. Contact a health care provider if: You have pain that gets worse or does not get better with medicine, especially pain when you urinate. You have trouble urinating. You have more blood in your urine. Get help right away if: You have blood clots in your urine. You have abdominal pain. You have a fever or chills. You are unable to urinate. Summary Cystoscopy is a procedure that is used to help diagnose and sometimes treat conditions that affect the lower urinary tract. Cystoscopy is done using a thin, tube-shaped instrument with a light and camera at the end. After the procedure, it is common to have some soreness or pain in your abdomen and urethra. Watch for any blood in your urine. If the amount of blood in your urine increases, call your health care provider. If you were prescribed an antibiotic medicine, take it as told by your health care provider. Do not stop taking  the antibiotic even if you start to feel better. This information is not intended to replace advice given to you by your health care provider. Make sure you discuss any questions you have with your healthcare provider. Document Revised: 06/06/2020 Document Reviewed: 06/06/2020 Elsevier Patient Education  2022 [employer]  Pelvic Floor Dysfunction  Pelvic floor dysfunction (PFD) is a condition that results when the group of muscles and connective tissues that support the organs in the pelvis (pelvic floor muscles) do not work well. These muscles and their connections form a sling that supports the colon and bladder. In men, these muscles also support the prostategland. In women, they also support the uterus. PFD causes pelvic floor muscles to be too weak, too tight, or a combination of both. In PFD, muscle movements are not coordinated. This condition may causebowel or bladder problems. It may also cause pain. What are the causes? This condition may be caused by an injury to the pelvic area or by a weakening of pelvic muscles. This often results from pregnancy and childbirth or othertypes of strain. In many cases, the exact cause is not known. What increases the risk? The following factors may make you more likely to develop this condition: Having a condition of chronic bladder tissue inflammation (interstitial cystitis). Being an older person. Being overweight. Radiation treatment for cancer in the pelvic region. Previous pelvic surgery, such as removal of the uterus (  hysterectomy) or prostate gland (prostatectomy). What are the signs or symptoms? Symptoms of this condition vary and may include: Bladder symptoms, such as: Trouble starting urination and emptying the bladder. Frequent urinary tract infections. Leaking urine when coughing, laughing, or exercising (stress incontinence). Having to pass urine urgently or frequently. Pain when passing urine. Bowel symptoms, such  as: Constipation. Urgent or frequent bowel movements. Incomplete bowel movements. Painful bowel movements. Leaking stool or gas. Unexplained genital or rectal pain. Genital or rectal muscle spasms. Low back pain. In women, symptoms of PFD may also include: A heavy, full, or aching feeling in the vagina. A bulge that protrudes into the vagina. Pain during or after sexual intercourse. How is this diagnosed? This condition may be diagnosed based on: Your symptoms and medical history. A physical exam. During the exam, your health care provider may check your pelvic muscles for tightness, spasm, pain, or weakness. This may include a rectal exam and a pelvic exam for women. In some cases, you may have diagnostic tests, such as: Electrical muscle function tests. Urine flow testing. X-ray tests of bowel function. Ultrasound of the pelvic organs. How is this treated? Treatment for this condition depends on your symptoms. Treatment options include: Physical therapy. This may include Kegel exercises to help relax or strengthen the pelvic floor muscles. Biofeedback. This type of therapy provides feedback on how tight your pelvic floor muscles are so that you can learn to control them. Internal or external massage therapy. A treatment that involves electrical stimulation of the pelvic floor muscles to help control pain (transcutaneous electrical nerve stimulation, or TENS). Sound wave therapy (ultrasound) to reduce muscle spasms. Medicines, such as: Muscle relaxants. Bladder control medicines. Surgery to reconstruct or support pelvic floor muscles may be an option ifother treatments do not help. Follow these instructions at home: Activity Do your usual activities as told by your health care provider. Ask your health care provider if you should modify any activities. Do pelvic floor strengthening or relaxing exercises at home as told by your physical therapist. Lifestyle Maintain a healthy  weight. Eat foods that are high in fiber, such as beans, whole grains, and fresh fruits and vegetables. Limit foods that are high in fat and processed sugars, such as fried or sweet foods. Manage stress with relaxation techniques such as yoga or meditation. General instructions If you have problems with leakage: Use absorbable pads or wear padded underwear. Wash frequently with mild soap. Keep your genital and anal area as clean and dry as possible. Ask your health care provider if you should try a barrier cream to prevent skin irritation. Take warm baths to relieve pelvic muscle tension or spasms. Take over-the-counter and prescription medicines only as told by your health care provider. Keep all follow-up visits as told by your health care provider. This is important. Contact a health care provider if you: Are not improving with home care. Have signs or symptoms of PFD that get worse at home. Develop new signs or symptoms at home. Have signs of a urinary tract infection, such as: Fever. Chills. Urinary frequency. A burning feeling when urinating. Have not had a bowel movement in 3 days (constipation). Summary Pelvic floor dysfunction results when the muscles and connective tissues in your pelvic floor do not work well. These muscles and their connections form a sling that supports your colon and bladder. In men, these muscles also support the prostate gland. In women, they also support the uterus. PFD may be caused by an injury to  the pelvic area or by a weakening of pelvic muscles. PFD causes pelvic floor muscles to be too weak, too tight, or a combination of both. Symptoms may vary from person to person. In most cases, PFD can be treated with physical therapies and medicines. Surgery may be an option if other treatments do not help. This information is not intended to replace advice given to you by your health care provider. Make sure you discuss any questions you have with your  healthcare provider. Document Revised: 05/15/2018 Document Reviewed: 05/15/2018 Elsevier Patient Education  2022 [employer].

## 2022-05-14 ENCOUNTER — Encounter: Payer: Self-pay | Admitting: Emergency Medicine

## 2022-05-14 ENCOUNTER — Ambulatory Visit
Admission: EM | Admit: 2022-05-14 | Discharge: 2022-05-14 | Disposition: A | Discharge status: Home / Self Care | Payer: BC Managed Care – PPO | Attending: Physician Assistant | Admitting: Physician Assistant

## 2022-05-14 DIAGNOSIS — L559 Sunburn, unspecified: Secondary | ICD-10-CM

## 2022-05-14 MED ORDER — LIDOCAINE 5 % EX OINT: 1.0000 | TOPICAL_OINTMENT | Freq: Four times a day (QID) | CUTANEOUS | 1 refills | Status: DC | PRN

## 2022-05-14 NOTE — Discharge Instructions (Signed)
-  Continue with Tylenol and ibuprofen for the pain - Cool compresses/cool rags or ice - I have sent lidocaine ointment for you.  This should help numb your skin a little. - This should get better over the next week. -Make sure you definitely wear sunscreen in the future when you are going to be out in the sun for long periods of time.

## 2022-05-14 NOTE — ED Triage Notes (Signed)
Patient states that he got sunburn on his chest, abdomen, and thighs on Wed. Patient has been taking Ibuprofen and applying aloe vera.

## 2022-05-14 NOTE — ED Provider Notes (Signed)
[facility]    CSN: [id number] Arrival date & time: 05/14/22  1549      History   Chief Complaint Chief Complaint  Patient presents with   Sunburn    HPI Johnny Mckinney is a 36 y.o. male presenting for sunburn for the past 3 days.  Patient reports he was out swimming and did not wear sunscreen.  Reports that he has taken ibuprofen and Tylenol as well as applied aloe without much relief at all of the sunburn.  Reports he has a couple tiny blisters of his chest.  The sunburn affects his entire chest, abdomen and bilateral thighs.  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 Authorizing Provider  lidocaine (XYLOCAINE) 5 % ointment Apply 1 Application topically 4 (four) times daily as needed for moderate pain. 05/14/22  Yes Shirlee Latch, PA-C  albuterol (VENTOLIN HFA) 108 (90 Base) MCG/ACT inhaler Inhale 2 puffs into the lungs every 4 (four) hours as needed for wheezing or shortness of breath. 11/04/20   Becky Augusta, NP  cetirizine (ZYRTEC) 10 MG tablet Take 1 tablet (10 mg total) by mouth daily. 06/27/19   Jamelle Haring, MD  fluticasone (FLONASE) 50 MCG/ACT nasal spray Place 1 spray into both nostrils daily. 06/27/19   Jamelle Haring, MD  Spacer/Aero-Holding Chambers (AEROCHAMBER MV) inhaler Use as instructed 11/04/20   Becky Augusta, NP  valACYclovir (VALTREX) 1000 MG tablet Take 1 tablet (1,000 mg total) by mouth 2 (two) times daily. 05/27/21   Delton See, MD    Family History Family History  Problem Relation Age of Onset   COPD Father    Emphysema Father      Social History Social History   Tobacco Use   Smoking status: Never   Smokeless tobacco: Never  Vaping Use   Vaping Use: Never used  Substance Use Topics   Alcohol use: Not Currently   Drug use: Never     Allergies   Patient has no known allergies.   Review of Systems Review of Systems  Constitutional:  Negative for fatigue and fever.  Respiratory:  Negative for shortness of breath.   Musculoskeletal:  Negative for arthralgias and joint swelling.  Skin:  Positive for color change.  Neurological:  Negative for weakness.     Physical Exam Triage Vital Signs ED Triage Vitals  Enc Vitals Group     BP 05/14/22 1600 (!) 137/91     Pulse Rate 05/14/22 1600 72     Resp 05/14/22 1600 15     Temp 05/14/22 1600 98.7 F (37.1 C)     Temp Source 05/14/22 1600 Oral     SpO2 05/14/22 1600 99 %     Weight 05/14/22 1559 165 lb (74.8 kg)     Height 05/14/22 1559 5\' 9"  (1.753 m)     Head Circumference --      Peak Flow --      Pain Score 05/14/22 1559 9     Pain Loc --  Pain Edu? --      Excl. in GC? --    No data found.  Updated Vital Signs BP (!) 137/91 (BP Location: Left Arm)   Pulse 72   Temp 98.7 F (37.1 C) (Oral)   Resp 15   Ht 5\' 9"  (1.753 m)   Wt 165 lb (74.8 kg)   SpO2 99%   BMI 24.37 kg/m      Physical Exam Vitals and nursing note reviewed.  Constitutional:      General: He is not in acute distress.    Appearance: Normal appearance. He is well-developed. He is not ill-appearing.  HENT:     Head: Normocephalic and atraumatic.     Nose: Nose normal.     Mouth/Throat:     Mouth: Mucous membranes are moist.     Pharynx: Oropharynx is clear.  Eyes:     General: No scleral icterus.    Conjunctiva/sclera: Conjunctivae normal.  Cardiovascular:     Rate and Rhythm: Normal rate and regular rhythm.     Heart sounds: Normal heart sounds.  Pulmonary:     Effort: Pulmonary effort is normal. No respiratory distress.     Breath sounds: Normal breath  sounds.  Musculoskeletal:     Cervical back: Neck supple.  Skin:    General: Skin is warm and dry.     Capillary Refill: Capillary refill takes less than 2 seconds.     Comments: Diffuse erythema and increased warmth of entire chest and abdomen as well as bilateral anterior thighs consistent with first-degree sunburn.  Neurological:     General: No focal deficit present.     Mental Status: He is alert.  Psychiatric:        Mood and Affect: Mood normal.        Behavior: Behavior normal.      UC Treatments / Results  Labs (all labs ordered are listed, but only abnormal results are displayed) Labs Reviewed - No data to display  EKG   Radiology No results found.  Procedures Procedures (including critical care time)  Medications Ordered in UC Medications - No data to display  Initial Impression / Assessment and Plan / UC Course  I have reviewed the triage vital signs and the nursing notes.  Pertinent labs & imaging results that were available during my care of the patient were reviewed by me and considered in my medical decision making (see chart for details).  36 year old male presenting for sunburn of chest, abdomen and bilateral thighs.  No real improvement with ibuprofen, Tylenol and aloe.  On exam he does have significant sunburn of the areas mentioned.  We will add topical lidocaine and advised of cool compresses.  Work note given.  Follow-up as needed if the sunburn worsens.  Advise going to ED for acute worsening of the sunburn.   Final Clinical Impressions(s) / UC Diagnoses   Final diagnoses:  Sunburn     Discharge Instructions      -Continue with Tylenol and ibuprofen for the pain - Cool compresses/cool rags or ice - I have sent lidocaine ointment for you.  This should help numb your skin a little. - This should get better over the next week. -Make sure you definitely wear sunscreen in the future when you are going to be out in the sun for long periods of  time.   ED Prescriptions     Medication Sig Dispense Auth. Provider   lidocaine (XYLOCAINE) 5 % ointment Apply 1 Application topically  4 (four) times daily as needed for moderate pain. 240 g Gareth Morgan      PDMP not reviewed this encounter.   Eusebio Friendly B, PA-C 05/14/22 1709

## 2022-10-08 ENCOUNTER — Ambulatory Visit
Admission: EM | Admit: 2022-10-08 | Discharge: 2022-10-08 | Disposition: A | Discharge status: Home / Self Care | Payer: BC Managed Care – PPO | Attending: Urgent Care | Admitting: Urgent Care

## 2022-10-08 ENCOUNTER — Encounter: Payer: Self-pay | Admitting: Emergency Medicine

## 2022-10-08 DIAGNOSIS — R6889 Other general symptoms and signs: Secondary | ICD-10-CM

## 2022-10-08 DIAGNOSIS — J019 Acute sinusitis, unspecified: Secondary | ICD-10-CM

## 2022-10-08 DIAGNOSIS — B9789 Other viral agents as the cause of diseases classified elsewhere: Secondary | ICD-10-CM

## 2022-10-08 MED ORDER — HYDROCOD POLI-CHLORPHE POLI ER 10-8 MG/5ML PO SUER: 5.0000 mL | Freq: Two times a day (BID) | ORAL | 0 refills | Status: AC | PRN

## 2022-10-08 MED ORDER — PREDNISONE 20 MG PO TABS: ORAL_TABLET | ORAL | 0 refills | Status: AC

## 2022-10-08 MED ORDER — BENZONATATE 100 MG PO CAPS: ORAL_CAPSULE | ORAL | 0 refills | Status: DC

## 2022-10-08 NOTE — Discharge Instructions (Signed)
You have been diagnosed with a viral upper respiratory infection based on your symptoms and exam. Viral illnesses cannot be treated with antibiotics - they are self limiting - and you should find your symptoms resolving within a few days.  We have NOT performed a respiratory swab checking for COVID, influenza, or RSV because a positive result will not change the treatment plan.    I have prescribed cough medication to help you with your symptoms.  Please use benzonatate daytime or nighttime.  This medication is not sedating.  Tussionex is a narcotic based cough medication that is sedating.  Recommend use of this medication at night or when you are able to sleep.  Do not drive or operate machinery while you are using this medication.  Because of your severe sinus symptoms, I have recommended a course of a corticosteroid, prednisone, which will help reduce sinus inflammation.  This medication should also help reduce your cough symptoms.  Recommend taking this medication in the morning to avoid sleep disturbance at night.  It has a tendency to provoke hyperactivity and "jittery feeling".  We recommend you use over-the-counter medications for symptom control including Tylenol or ibuprofen for fever, chills or body aches.  Saline mist spray is helpful for removing excess mucus from your nose.  Room humidifiers are helpful to ease breathing at night. You may continue to use OTC cold/flu medications containing nasal decongestants such as Sudafed sinus (pseudoephedrine). Speak to the pharmacist to verify that you are not duplicating medications with other over-the-counter formulations that you may be using.  Follow up here or with your primary care provider if your symptoms are worsening or not improving.

## 2022-10-08 NOTE — ED Triage Notes (Signed)
Patient c/o cough, chest congestion, runny nose, and fever that started on Monday.  Patient has 2 negative covid tests at home.  Patient states that he needs a work note.

## 2022-10-08 NOTE — ED Provider Notes (Addendum)
[facility]    CSN: [id number] Arrival date & time: 10/08/22  0801      History   Chief Complaint Chief Complaint  Patient presents with   Cough   Nasal Congestion    HPI Johnny Mckinney is a 36 y.o. male.    Cough   Patient presents to UC with c/o cough, chest congestion, runny nose and fever, chills, body aches starting Monday (4 days). He reports acutely sore throat starting Monday which has improved. Reports frequent diarrhea each time after eating. Denies n/v/d.  Reports cough is "hacking" and contstant, preventing him from sleeping. Feels like "head is a balloon" and "going to explode". Reports headache. Ears are popping.  Endorses negative covid at home x 2.  Requests work note.     Past Medical History:  Diagnosis Date   Chlorine gas exposure    Sinusitis     Patient Active Problem List   Diagnosis Date Noted   Headache 09/26/2019   Hemorrhoids 09/26/2019   Low back pain 03/15/2014    Past Surgical History:  Procedure Laterality Date   UPPER GI ENDOSCOPY     wisdom teex removed Bilateral        Home Medications    Prior to Admission medications   Medication Sig Start Date End Date Taking? Authorizing Provider  albuterol (VENTOLIN HFA) 108 (90 Base) MCG/ACT inhaler Inhale 2 puffs into the lungs every 4 (four) hours as needed for wheezing or shortness of breath. 11/04/20   Becky Augusta, NP  cetirizine (ZYRTEC) 10 MG tablet Take 1 tablet (10 mg total) by mouth daily. 06/27/19   Jamelle Haring, MD  fluticasone (FLONASE) 50 MCG/ACT nasal spray Place 1 spray into both nostrils daily. 06/27/19   Jamelle Haring, MD  lidocaine (XYLOCAINE) 5 % ointment Apply 1 Application topically 4 (four) times daily as needed for moderate pain. 05/14/22   Shirlee Latch, PA-C  Spacer/Aero-Holding Chambers (AEROCHAMBER MV) inhaler Use as instructed 11/04/20   Becky Augusta, NP  valACYclovir (VALTREX) 1000 MG tablet Take 1 tablet (1,000 mg  total) by mouth 2 (two) times daily. 05/27/21   Delton See, MD    Family History Family History  Problem Relation Age of Onset   COPD Father    Emphysema Father     Social History Social History   Tobacco Use   Smoking status: Never   Smokeless tobacco: Never  Vaping Use   Vaping Use: Never used  Substance Use Topics   Alcohol use: Not Currently   Drug use: Never     Allergies   Patient has no known allergies.   Review of Systems Review of Systems  Respiratory:  Positive for cough.      Physical Exam Triage Vital Signs ED Triage Vitals  Enc Vitals Group     BP --      Pulse --      Resp --      Temp --      Temp src --      SpO2 --      Weight 10/08/22 0811 170 lb (77.1 kg)     Height 10/08/22 0811 5\' 9"  (1.753 m)     Head Circumference --      Peak Flow --      Pain Score 10/08/22 0810 8     Pain Loc --      Pain Edu? --      Excl. in [place]? --    No  data found.  Updated Vital Signs Ht 5\' 9"  (1.753 m)   Wt 170 lb (77.1 kg)   BMI 25.10 kg/m   Visual Acuity Right Eye Distance:   Left Eye Distance:   Bilateral Distance:    Right Eye Near:   Left Eye Near:    Bilateral Near:     Physical Exam Vitals reviewed.  Constitutional:      Appearance: Normal appearance. He is ill-appearing. He is not toxic-appearing.  HENT:     Right Ear: Tympanic membrane normal. There is impacted cerumen.     Left Ear: Tympanic membrane normal.     Nose: Congestion present.     Mouth/Throat:     Mouth: Mucous membranes are moist.     Pharynx: Posterior oropharyngeal erythema present. No oropharyngeal exudate.     Tonsils: No tonsillar exudate. 3+ on the right. 3+ on the left.  Cardiovascular:     Rate and Rhythm: Normal rate and regular rhythm.     Pulses: Normal pulses.     Heart sounds: Normal heart sounds.  Pulmonary:     Effort: Pulmonary effort is normal.     Breath sounds: Normal breath sounds. No wheezing or rhonchi.  Skin:    General: Skin is  warm and dry.  Neurological:     General: No focal deficit present.     Mental Status: He is alert and oriented to person, place, and time.  Psychiatric:        Mood and Affect: Mood normal.        Behavior: Behavior normal.      UC Treatments / Results  Labs (all labs ordered are listed, but only abnormal results are displayed) Labs Reviewed - No data to display  EKG   Radiology No results found.  Procedures Procedures (including critical care time)  Medications Ordered in UC Medications - No data to display  Initial Impression / Assessment and Plan / UC Course  I have reviewed the triage vital signs and the nursing notes.  Pertinent labs & imaging results that were available during my care of the patient were reviewed by me and considered in my medical decision making (see chart for details).   Patient is afebrile here while using recent antipyretics (alternating tylenol/ibuprofen). Satting well on room air. Overall is ill appearing, nontoxic. He appears well hydrated, without respiratory distress. Pulmonary exam is unremarkable.  Lungs CTAB without rhonchi or wheezing.  TMs are WNL.  Cerumen burden in left EAC.  Pharyngeal erythema is present without peritonsillar exudates.  Tonsils are +3 bilaterally.  Maxillary and frontal sinus tenderness bilaterally with palpation.  Given duration of symptoms, likely viral pathology for his symptoms, possibly flu/covid. No change in treatment plan for positive result and declined swab after shared decision making.   Recommending OTC and prescriptive medications for relief of symptoms. Will prescribe benzonatate for daytime cough and tussionex for nighttime. He endorses previous tolerance of narcotic cough medication. Given severe sinus symptoms, agreed to prescribe course of prednisone to be taken daily in AM.   Otherwise, recommending continued use of OTC medications.  Final Clinical Impressions(s) / UC Diagnoses   Final diagnoses:   None   Discharge Instructions   None    ED Prescriptions   None    PDMP not reviewed this encounter.   , FNP 10/08/22 0830    Immordino14/01/23, Jeannett Senior 10/08/22 [phone]

## 2022-12-24 ENCOUNTER — Ambulatory Visit
Admission: EM | Admit: 2022-12-24 | Discharge: 2022-12-24 | Disposition: A | Discharge status: Home / Self Care | Payer: Self-pay | Attending: Physician Assistant | Admitting: Physician Assistant

## 2022-12-24 DIAGNOSIS — R52 Pain, unspecified: Secondary | ICD-10-CM | POA: Insufficient documentation

## 2022-12-24 DIAGNOSIS — R051 Acute cough: Secondary | ICD-10-CM | POA: Insufficient documentation

## 2022-12-24 DIAGNOSIS — U071 COVID-19: Secondary | ICD-10-CM | POA: Insufficient documentation

## 2022-12-24 LAB — SARS CORONAVIRUS 2 BY RT PCR: SARS Coronavirus 2 by RT PCR: POSITIVE — AB

## 2022-12-24 LAB — RAPID INFLUENZA A&B ANTIGENS
Influenza A (ARMC): NEGATIVE
Influenza B (ARMC): NEGATIVE

## 2022-12-24 NOTE — ED Provider Notes (Signed)
[facility]    CSN: [id number] Arrival date & time: 12/24/22  0804      History   Chief Complaint Chief Complaint  Patient presents with   Cough   Generalized Body Aches   Nasal Congestion    HPI Johnny Mckinney is a 37 y.o. male presenting for 3- day history of nasal congestion, headaches, body aches, cough and chills. Reports fever up to 102 degrees. Denies sore throat, breathing difficulty, nausea/vomiting/diarrhea. Reports that he has had ill co-workers but no one has tested positive for COVID. Has been taking Theraflu to help ease symptoms. No other concerns.  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 Authorizing Provider  albuterol (VENTOLIN HFA) 108 (90 Base) MCG/ACT inhaler Inhale 2 puffs into the lungs every 4 (four) hours as needed for wheezing or shortness of breath. 11/04/20  Yes Margarette Canada, NP  cetirizine (ZYRTEC) 10 MG tablet Take 1 tablet (10 mg total) by mouth daily. 06/27/19  Yes Towanda Malkin, MD  fluticasone (FLONASE) 50 MCG/ACT nasal spray Place 1 spray into both nostrils daily. 06/27/19  Yes Towanda Malkin, MD  Spacer/Aero-Holding Chambers (AEROCHAMBER MV) inhaler Use as instructed 11/04/20  Yes Margarette Canada, NP  benzonatate (TESSALON) 100 MG capsule Take 1-2 tablets 3 times a day as needed for cough 10/08/22   Immordino, Annie Main, FNP  lidocaine (XYLOCAINE) 5 % ointment Apply 1 Application topically 4 (four) times daily as needed for moderate pain. 05/14/22   Danton Clap, PA-C  valACYclovir (VALTREX) 1000 MG tablet Take 1 tablet (1,000 mg  total) by mouth 2 (two) times daily. 05/27/21   Verda Cumins, MD    Family History Family History  Problem Relation Age of Onset   COPD Father    Emphysema Father     Social History Social History   Tobacco Use   Smoking status: Never   Smokeless tobacco: Never  Vaping Use   Vaping Use: Never used  Substance Use Topics   Alcohol use: Not Currently   Drug use: Never     Allergies   Patient has no known allergies.   Review of Systems Review of Systems  Constitutional:  Positive for fatigue and fever.  HENT:  Positive for congestion and rhinorrhea. Negative for sinus pressure, sinus pain and sore throat.   Respiratory:  Positive for cough. Negative for shortness of breath.   Gastrointestinal:  Negative for abdominal pain, diarrhea, nausea and vomiting.  Musculoskeletal:  Positive for myalgias.  Neurological:  Positive for headaches. Negative for weakness and light-headedness.  Hematological:  Negative for adenopathy.     Physical Exam Triage Vital Signs ED Triage Vitals  Enc Vitals Group     BP      Pulse      Resp      Temp      Temp src      SpO2      Weight      Height      Head Circumference  Peak Flow      Pain Score      Pain Loc      Pain Edu?      Excl. in [place]?    No data found.  Updated Vital Signs BP (!) 139/103 (BP Location: Left Arm)   Pulse 76   Temp 99.2 F (37.3 C) (Oral)   Ht 5' 9"$  (1.753 m)   Wt 155 lb (70.3 kg)   SpO2 100%   BMI 22.89 kg/m       Physical Exam Vitals and nursing note reviewed.  Constitutional:      General: He is not in acute distress.    Appearance: Normal appearance. He is well-developed. He is not ill-appearing.  HENT:     Head: Normocephalic and atraumatic.     Nose: Congestion present.     Mouth/Throat:     Mouth: Mucous membranes are moist.     Pharynx: Oropharynx is clear.  Eyes:     General: No scleral icterus.    Conjunctiva/sclera: Conjunctivae normal.  Cardiovascular:     Rate and  Rhythm: Normal rate and regular rhythm.     Heart sounds: Normal heart sounds.  Pulmonary:     Effort: Pulmonary effort is normal. No respiratory distress.     Breath sounds: Normal breath sounds.  Musculoskeletal:     Cervical back: Neck supple.  Skin:    General: Skin is warm and dry.     Capillary Refill: Capillary refill takes less than 2 seconds.  Neurological:     General: No focal deficit present.     Mental Status: He is alert. Mental status is at baseline.     Motor: No weakness.     Gait: Gait normal.  Psychiatric:        Mood and Affect: Mood normal.        Behavior: Behavior normal.      UC Treatments / Results  Labs (all labs ordered are listed, but only abnormal results are displayed) Labs Reviewed  SARS CORONAVIRUS 2 BY RT PCR - Abnormal; Notable for the following components:      Result Value   SARS Coronavirus 2 by RT PCR POSITIVE (*)    All other components within normal limits  RAPID INFLUENZA A&B ANTIGENS    EKG   Radiology No results found.  Procedures Procedures (including critical care time)  Medications Ordered in UC Medications - No data to display  Initial Impression / Assessment and Plan / UC Course  I have reviewed the triage vital signs and the nursing notes.  Pertinent labs & imaging results that were available during my care of the patient were reviewed by me and considered in my medical decision making (see chart for details).   37 y/o male presents for 3-4 day history of fever, headaches, body aches, cough, congestion, and chills.  Patient is afebrile. He is overall well appearing.  On exam he has nasal congestion.  Throat is clear.  Chest clear to auscultation.  COVID and flu testing obtained.  Positive COVID test.  Negative flu.  Reviewed results with patient. Advised increasing rest and fluids.  Current CDC guidelines, isolation protocol and ED precautions.  Advise continuing TheraFlu.  Work note provided.   Final Clinical  Impressions(s) / UC Diagnoses   Final diagnoses:  COVID-19  Acute cough  Body aches     Discharge Instructions      -You are positive for COVID. You will need to isolate x 5 days  and then wear a mask x 5 days -Increase rest and fluids.  -Take cough medications/decongestants. Ibuprofen and/or Tylenol for pain, fever, headaches.  -Return if uncontrollable fever, weakness, breathing difficulty.     ED Prescriptions   None    PDMP not reviewed this encounter.   Danton Clap, PA-C 12/24/22 [phone]

## 2022-12-24 NOTE — Discharge Instructions (Addendum)
-  You are positive for COVID. You will need to isolate x 5 days and then wear a mask x 5 days -Increase rest and fluids.  -Take cough medications/decongestants. Ibuprofen and/or Tylenol for pain, fever, headaches.  -Return if uncontrollable fever, weakness, breathing difficulty.

## 2022-12-24 NOTE — ED Triage Notes (Signed)
Pt c/o nasal congestion, body chills, body aches, chest congestion and non productive cough x2days  Pt works at [employer] and was around sick coworkers who were negative for covid.   Pt asks for covid and flu testing.  Pt has been using theraflu to help sleep.

## 2023-02-28 ENCOUNTER — Ambulatory Visit
Admission: EM | Admit: 2023-02-28 | Discharge: 2023-02-28 | Disposition: A | Discharge status: Home / Self Care | Payer: Self-pay | Attending: Nurse Practitioner | Admitting: Nurse Practitioner

## 2023-02-28 DIAGNOSIS — J309 Allergic rhinitis, unspecified: Secondary | ICD-10-CM | POA: Insufficient documentation

## 2023-02-28 DIAGNOSIS — J302 Other seasonal allergic rhinitis: Secondary | ICD-10-CM | POA: Insufficient documentation

## 2023-02-28 DIAGNOSIS — Z1152 Encounter for screening for COVID-19: Secondary | ICD-10-CM | POA: Insufficient documentation

## 2023-02-28 DIAGNOSIS — J029 Acute pharyngitis, unspecified: Secondary | ICD-10-CM | POA: Insufficient documentation

## 2023-02-28 LAB — SARS CORONAVIRUS 2 BY RT PCR: SARS Coronavirus 2 by RT PCR: NEGATIVE

## 2023-02-28 LAB — GROUP A STREP BY PCR: Group A Strep by PCR: NOT DETECTED

## 2023-02-28 MED ORDER — PREDNISONE 20 MG PO TABS: 40.0000 mg | ORAL_TABLET | Freq: Every day | ORAL | 0 refills | Status: AC

## 2023-02-28 NOTE — ED Provider Notes (Signed)
[facility]    CSN: [id number] Arrival date & time: 02/28/23  1131      History   Chief Complaint Chief Complaint  Patient presents with   Nasal Congestion   Cough    HPI Johnny Mckinney is a 37 y.o. male  presents for evaluation of URI symptoms for 2 days. Patient reports associated symptoms of cough, congestion, sneezing, scratchy/sore throat, watery eyes. Denies N/V/D, fevers, body aches, shortness of breath, ear pain. Patient does not have a hx of asthma or smoking. No known sick contacts.  He does have history of allergies.  He did go up into a dusty storage facility the day of symptom onset.  Pt has taken Zyrtec and Flonase OTC for symptoms. Pt has no other concerns at this time.    Cough Associated symptoms: sore throat     Past Medical History:  Diagnosis Date   Chlorine gas exposure    Sinusitis     Patient Active Problem List   Diagnosis Date Noted   Headache 09/26/2019   Hemorrhoids 09/26/2019   Low back pain 03/15/2014    Past Surgical History:  Procedure Laterality Date   UPPER GI ENDOSCOPY     wisdom teex removed Bilateral        Home Medications    Prior to Admission medications   Medication Sig Start Date End Date Taking? Authorizing Provider  predniSONE (DELTASONE) 20 MG tablet Take 2 tablets (40 mg total) by mouth daily with breakfast for 5 days. 02/28/23 03/05/23 Yes Radford Pax, NP  albuterol (VENTOLIN HFA) 108 (90 Base) MCG/ACT inhaler Inhale 2 puffs into the lungs every 4 (four) hours as needed for wheezing or shortness of breath. 11/04/20   Becky Augusta, NP  benzonatate (TESSALON) 100 MG capsule Take 1-2 tablets 3 times a day as needed for cough 10/08/22   Immordino, Jeannett Senior, FNP  cetirizine (ZYRTEC) 10 MG tablet Take 1 tablet (10 mg total) by mouth daily. 06/27/19   Jamelle Haring, MD  fluticasone (FLONASE) 50 MCG/ACT nasal spray Place 1 spray into both nostrils daily. 06/27/19   Jamelle Haring, MD  lidocaine  (XYLOCAINE) 5 % ointment Apply 1 Application topically 4 (four) times daily as needed for moderate pain. 05/14/22   Shirlee Latch, PA-C  Spacer/Aero-Holding Chambers (AEROCHAMBER MV) inhaler Use as instructed 11/04/20   Becky Augusta, NP  valACYclovir (VALTREX) 1000 MG tablet Take 1 tablet (1,000 mg total) by mouth 2 (two) times daily. 05/27/21   Delton See, MD    Family History Family History  Problem Relation Age of Onset   COPD Father    Emphysema Father     Social History Social History   Tobacco Use   Smoking status: Never   Smokeless tobacco: Never  Vaping Use   Vaping Use: Never used  Substance Use Topics   Alcohol use: Not Currently   Drug use: Never     Allergies   Patient has no known allergies.   Review of Systems Review of Systems  HENT:  Positive for congestion, sneezing and sore throat.   Eyes:        Watery eyes  Respiratory:  Positive for cough.      Physical Exam Triage Vital Signs ED Triage Vitals [02/28/23 1218]  Enc Vitals Group     BP (!) 131/97     Pulse Rate 68     Resp      Temp 98.7 F (37.1 C)  Temp Source Oral     SpO2 97 %     Weight 165 lb (74.8 kg)     Height  (1.753 m)     Head Circumference      Peak Flow      Pain Score 9     Pain Loc      Pain Edu?      Excl. in [place]?    No data found.  Updated Vital Signs BP (!) 131/97 (BP Location: Right Arm)   Pulse 68   Temp 98.7 F (37.1 C) (Oral)   Ht  (1.753 m)   Wt 165 lb (74.8 kg)   SpO2 97%   BMI 24.37 kg/m   Visual Acuity Right Eye Distance:   Left Eye Distance:   Bilateral Distance:    Right Eye Near:   Left Eye Near:    Bilateral Near:     Physical Exam Vitals and nursing note reviewed.  Constitutional:      General: He is not in acute distress.    Appearance: Normal appearance. He is not ill-appearing or toxic-appearing.  HENT:     Head: Normocephalic and atraumatic.     Right Ear: Tympanic membrane and ear canal normal.     Left Ear:  Tympanic membrane and ear canal normal.     Nose: Congestion present.     Mouth/Throat:     Mouth: Mucous membranes are moist.     Pharynx: Posterior oropharyngeal erythema present.  Eyes:     General:        Right eye: No discharge.        Left eye: No discharge.     Extraocular Movements: Extraocular movements intact.     Conjunctiva/sclera: Conjunctivae normal.     Pupils: Pupils are equal, round, and reactive to light.  Cardiovascular:     Rate and Rhythm: Normal rate and regular rhythm.     Heart sounds: Normal heart sounds.  Pulmonary:     Effort: Pulmonary effort is normal.     Breath sounds: Normal breath sounds.  Musculoskeletal:     Cervical back: Normal range of motion and neck supple.  Lymphadenopathy:     Cervical: No cervical adenopathy.  Skin:    General: Skin is warm and dry.  Neurological:     General: No focal deficit present.     Mental Status: He is alert and oriented to person, place, and time.  Psychiatric:        Mood and Affect: Mood normal.        Behavior: Behavior normal.      UC Treatments / Results  Labs (all labs ordered are listed, but only abnormal results are displayed) Labs Reviewed  GROUP A STREP BY PCR  SARS CORONAVIRUS 2 BY RT PCR    EKG   Radiology No results found.  Procedures Procedures (including critical care time)  Medications Ordered in UC Medications - No data to display  Initial Impression / Assessment and Plan / UC Course  I have reviewed the triage vital signs and the nursing notes.  Pertinent labs & imaging results that were available during my care of the patient were reviewed by me and considered in my medical decision making (see chart for details).     Negative COVID and strep PCR.  Discussed likely allergies as cause of symptoms Trial of prednisone Advised changing from Zyrtec to another allergy med such as Claritin or Allegra Continue nasal sprays as needed PCP  follow-up symptoms do not improve ER  precautions reviewed and patient verbalized understanding Final Clinical Impressions(s) / UC Diagnoses   Final diagnoses:  Sore throat  Allergic rhinitis, unspecified seasonality, unspecified trigger  Seasonal allergies     Discharge Instructions      Start prednisone daily for 5 days Trying a new allergy medications as Claritin or Allegra Over-the-counter Mucinex as needed Follow-up with your PCP if your symptoms do not improve Please go to the ER for any worsening symptoms     ED Prescriptions     Medication Sig Dispense Auth. Provider   predniSONE (DELTASONE) 20 MG tablet Take 2 tablets (40 mg total) by mouth daily with breakfast for 5 days. 10 tablet Radford Pax, NP      PDMP not reviewed this encounter.   Radford Pax, NP 02/28/23 1332

## 2023-02-28 NOTE — ED Triage Notes (Addendum)
Pt c/o nasal congestion & cough onset Saturday, pt states started with scratchy throat, watery eyes, sneezing. Pt states he took some zyrtec & Flonase but didn't help much. Pt states he did go inside a storage building and was moving some things but there was a lot of dust and unsure if that caused sxs.

## 2023-02-28 NOTE — Discharge Instructions (Signed)
Start prednisone daily for 5 days Trying a new allergy medications as Claritin or Allegra Over-the-counter Mucinex as needed Follow-up with your PCP if your symptoms do not improve Please go to the ER for any worsening symptoms

## 2023-04-24 ENCOUNTER — Emergency Department
Admission: EM | Admit: 2023-04-24 | Discharge: 2023-04-24 | Disposition: A | Discharge status: Home / Self Care | Payer: Self-pay | Attending: Emergency Medicine | Admitting: Emergency Medicine

## 2023-04-24 ENCOUNTER — Other Ambulatory Visit: Payer: Self-pay

## 2023-04-24 ENCOUNTER — Encounter: Payer: Self-pay | Admitting: Emergency Medicine

## 2023-04-24 ENCOUNTER — Emergency Department: Payer: Self-pay

## 2023-04-24 DIAGNOSIS — S99921A Unspecified injury of right foot, initial encounter: Secondary | ICD-10-CM

## 2023-04-24 DIAGNOSIS — W228XXA Striking against or struck by other objects, initial encounter: Secondary | ICD-10-CM | POA: Insufficient documentation

## 2023-04-24 DIAGNOSIS — Y92831 Amusement park as the place of occurrence of the external cause: Secondary | ICD-10-CM | POA: Insufficient documentation

## 2023-04-24 DIAGNOSIS — S90121A Contusion of right lesser toe(s) without damage to nail, initial encounter: Secondary | ICD-10-CM | POA: Insufficient documentation

## 2023-04-24 NOTE — ED Triage Notes (Signed)
Patient was playing with his kids at a splash pad and hit his RIGHT middle toe on a lug nut sticking out of the ground - toe is purple and painful, but patient is able to ambulate

## 2023-04-24 NOTE — ED Provider Notes (Signed)
[facility] Provider Note    Event Date/Time   First MD Initiated Contact with Patient 04/24/23 1438     (approximate)   History   Toe Injury (Patient was playing with his kids at a splash pad and hit his RIGHT middle toe on a lug nut sticking out of the ground - toe is purple and painful, but patient is able to ambulate)   HPI  Johnny Mckinney is a 37 y.o. male yesterday patient was at a water park when he stubbed his right third toe on a piece of metal at the water park.  It has been black and blue and somewhat painful to ambulate on although he is able to ambulate.  Denies other injuries.  Took ibuprofen prior to arrival.     Past Medical History:  Diagnosis Date   Chlorine gas exposure    Sinusitis     Patient Active Problem List   Diagnosis Date Noted   Headache 09/26/2019   Hemorrhoids 09/26/2019   Low back pain 03/15/2014     Physical Exam  Triage Vital Signs: ED Triage Vitals [04/24/23 1429]  Enc Vitals Group     BP (!) 140/89     Pulse Rate 77     Resp 19     Temp 98.6 F (37 C)     Temp Source Oral     SpO2 97 %     Weight 165 lb (74.8 kg)     Height 5\' 9"  (1.753 m)     Head Circumference      Peak Flow      Pain Score 8     Pain Loc      Pain Edu?      Excl. in [place]?     Most recent vital signs: Vitals:   04/24/23 1429  BP: (!) 140/89  Pulse: 77  Resp: 19  Temp: 98.6 F (37 C)  SpO2: 97%     General: Awake, no distress.  CV:  Good peripheral perfusion.  Resp:  Normal effort.  Abd:  No distention.  Neuro:             Awake, Alert, Oriented x 3  Other:  Right third toe is diffusely ecchymotic but there is no deformity tender to palpation no open wound   ED Results / Procedures / Treatments  Labs (all labs ordered are listed, but only abnormal results are displayed) Labs Reviewed - No data to display   EKG     RADIOLOGY I reviewed and interpreted patient's x-ray of the right foot which does not show  an obvious fracture   PROCEDURES:  Critical Care performed: No  Procedures   MEDICATIONS ORDERED IN ED: Medications - No data to display   IMPRESSION / MDM / ASSESSMENT AND PLAN / ED COURSE  I reviewed the triage vital signs and the nursing notes.                              Patient's presentation is most consistent with acute, uncomplicated illness.  Differential diagnosis includes, but is not limited to, fracture, contusion  37 year old male presents after stubbing his right third toe on a metal object sticking on the ground at a water park.  This happened yesterday.  He has had pain in the right third toe since but is still able to ambulate.  On exam the toe is ecchymotic but there is no  open wound and it is not obviously deformed.  X-ray was obtained which is negative for fracture.  Recommended hard soled shoe, buddy taping as needed and RICE.  I did provide patient a work note as he is on his feet most the day at work.       FINAL CLINICAL IMPRESSION(S) / ED DIAGNOSES   Final diagnoses:  Injury of toe on right foot, initial encounter     Rx / DC Orders   ED Discharge Orders     None        Note:  This document was prepared using Dragon voice recognition software and may include unintentional dictation errors.   Georga Hacking, MD 04/24/23 [phone]

## 2023-04-24 NOTE — Discharge Instructions (Signed)
Your x-ray did not show a fracture.  You likely just bruised the toe significantly.  Wear a hard soled shoe and rest ice and elevate the foot.  You can take the toe to the neighboring toe if this helps your symptoms.

## 2023-05-09 ENCOUNTER — Encounter: Payer: Self-pay | Admitting: Emergency Medicine

## 2023-05-09 ENCOUNTER — Ambulatory Visit
Admission: EM | Admit: 2023-05-09 | Discharge: 2023-05-09 | Disposition: A | Discharge status: Home / Self Care | Payer: Self-pay | Attending: Emergency Medicine | Admitting: Emergency Medicine

## 2023-05-09 ENCOUNTER — Telehealth: Payer: Self-pay | Admitting: Emergency Medicine

## 2023-05-09 DIAGNOSIS — Z1152 Encounter for screening for COVID-19: Secondary | ICD-10-CM | POA: Insufficient documentation

## 2023-05-09 DIAGNOSIS — J069 Acute upper respiratory infection, unspecified: Secondary | ICD-10-CM | POA: Insufficient documentation

## 2023-05-09 LAB — GROUP A STREP BY PCR: Group A Strep by PCR: NOT DETECTED

## 2023-05-09 LAB — SARS CORONAVIRUS 2 BY RT PCR: SARS Coronavirus 2 by RT PCR: NEGATIVE

## 2023-05-09 NOTE — Discharge Instructions (Addendum)
Your symptoms today are most likely being caused by a virus and should steadily improve in time it can take up to 7 to 10 days before you truly start to see a turnaround however things will get better  COVID test is negative  Strep test is pending, you will be notified of test results via telephone and antibiotics sent and if positive    You can take Tylenol and/or Ibuprofen as needed for fever reduction and pain relief.   For cough: honey 1/2 to 1 teaspoon (you can dilute the honey in water or another fluid).  You can also use guaifenesin and dextromethorphan for cough. You can use a humidifier for chest congestion and cough.  If you don't have a humidifier, you can sit in the bathroom with the hot shower running.      For sore throat: try warm salt water gargles, cepacol lozenges, throat spray, warm tea or water with lemon/honey, popsicles or ice, or OTC cold relief medicine for throat discomfort.   For congestion: take a daily anti-histamine like Zyrtec, Claritin, and a oral decongestant, such as pseudoephedrine.  You can also use Flonase 1-2 sprays in each nostril daily.   It is important to stay hydrated: drink plenty of fluids (water, gatorade/powerade/pedialyte, juices, or teas) to keep your throat moisturized and help further relieve irritation/discomfort.

## 2023-05-09 NOTE — ED Provider Notes (Signed)
[facility]    CSN: [id number] Arrival date & time: 05/09/23  0809      History   Chief Complaint Chief Complaint  Patient presents with   Sore Throat   Generalized Body Aches   Fever   Cough   Nasal Congestion    HPI Johnny Mckinney is a 37 y.o. male.   Patient presents for evaluation of fever, body aches, nasal congestion, rhinorrhea, sore throat and nonproductive cough present for 4 days.  Associated diarrhea with last occurrence this morning.  Throat is described as scratchy and not painful.  Has attempted use of ibuprofen and Tylenol which have been minimally effective.  Known sick contact prior and other member of household has similar symptoms.  Able to tolerate food and liquids.  Denies respiratory history.   Past Medical History:  Diagnosis Date   Chlorine gas exposure    Sinusitis     Patient Active Problem List   Diagnosis Date Noted   Headache 09/26/2019   Hemorrhoids 09/26/2019   Low back pain 03/15/2014    Past Surgical History:  Procedure Laterality Date   UPPER GI ENDOSCOPY     wisdom teex removed Bilateral        Home Medications    Prior to Admission medications   Medication Sig Start Date End Date Taking? Authorizing Provider  albuterol (VENTOLIN HFA) 108 (90 Base) MCG/ACT inhaler Inhale 2 puffs into the lungs every 4 (four) hours as needed for wheezing or shortness of breath. 11/04/20   Becky Augusta, NP  benzonatate (TESSALON) 100 MG capsule Take 1-2 tablets 3 times a day as needed for cough 10/08/22   Immordino, Jeannett Senior, FNP  cetirizine (ZYRTEC) 10 MG tablet Take 1 tablet (10 mg total) by mouth daily. 06/27/19   Jamelle Haring, MD  fluticasone (FLONASE) 50 MCG/ACT nasal spray Place 1 spray into both nostrils daily. 06/27/19   Jamelle Haring, MD  lidocaine (XYLOCAINE) 5 % ointment Apply 1 Application topically 4 (four) times daily as needed for moderate pain. 05/14/22   Shirlee Latch, PA-C  Spacer/Aero-Holding  Chambers (AEROCHAMBER MV) inhaler Use as instructed 11/04/20   Becky Augusta, NP  valACYclovir (VALTREX) 1000 MG tablet Take 1 tablet (1,000 mg total) by mouth 2 (two) times daily. 05/27/21   Delton See, MD    Family History Family History  Problem Relation Age of Onset   COPD Father    Emphysema Father     Social History Social History   Tobacco Use   Smoking status: Never   Smokeless tobacco: Never  Vaping Use   Vaping Use: Never used  Substance Use Topics   Alcohol use: Not Currently   Drug use: Never     Allergies   Patient has no known allergies.   Review of Systems Review of Systems  Constitutional:  Positive for fever. Negative for activity change, appetite change, chills, diaphoresis, fatigue and unexpected weight change.  HENT:  Positive for congestion, rhinorrhea and sore throat. Negative for dental problem, drooling, ear discharge, ear pain, facial swelling, hearing loss, mouth sores, nosebleeds, postnasal drip, sinus pressure, sinus pain, sneezing, tinnitus, trouble swallowing and voice change.   Respiratory:  Positive for cough. Negative for apnea, choking, chest tightness, shortness of breath, wheezing and stridor.   Cardiovascular: Negative.   Gastrointestinal:  Positive for diarrhea. Negative for abdominal distention, abdominal pain, anal bleeding, blood in stool, constipation, nausea, rectal pain and vomiting.  Musculoskeletal:  Positive for myalgias. Negative for arthralgias, back  pain, gait problem, joint swelling, neck pain and neck stiffness.  Neurological: Negative.      Physical Exam Triage Vital Signs ED Triage Vitals  Enc Vitals Group     BP 05/09/23 0834 (!) 155/90     Pulse Rate 05/09/23 0834 96     Resp 05/09/23 0834 16     Temp 05/09/23 0834 98.5 F (36.9 C)     Temp Source 05/09/23 0834 Oral     SpO2 05/09/23 0834 96 %     Weight --      Height --      Head Circumference --      Peak Flow --      Pain Score 05/09/23 0832 6      Pain Loc --      Pain Edu? --      Excl. in GC? --    No data found.  Updated Vital Signs BP (!) 155/90 (BP Location: Left Arm)   Pulse 96   Temp 98.5 F (36.9 C) (Oral)   Resp 16   SpO2 96%   Visual Acuity Right Eye Distance:   Left Eye Distance:   Bilateral Distance:    Right Eye Near:   Left Eye Near:    Bilateral Near:     Physical Exam Constitutional:      Appearance: He is well-developed.  HENT:     Right Ear: Tympanic membrane and ear canal normal.     Left Ear: Tympanic membrane and ear canal normal.     Nose: Congestion present. No rhinorrhea.     Mouth/Throat:     Pharynx: No posterior oropharyngeal erythema.     Tonsils: No tonsillar exudate. 3+ on the right. 3+ on the left.  Cardiovascular:     Rate and Rhythm: Normal rate and regular rhythm.     Pulses: Normal pulses.     Heart sounds: Normal heart sounds.  Pulmonary:     Effort: Pulmonary effort is normal.     Breath sounds: Normal breath sounds.      UC Treatments / Results  Labs (all labs ordered are listed, but only abnormal results are displayed) Labs Reviewed  SARS CORONAVIRUS 2 BY RT PCR    EKG   Radiology No results found.  Procedures Procedures (including critical care time)  Medications Ordered in UC Medications - No data to display  Initial Impression / Assessment and Plan / UC Course  I have reviewed the triage vital signs and the nursing notes.  Pertinent labs & imaging results that were available during my care of the patient were reviewed by me and considered in my medical decision making (see chart for details).  Viral URI with cough  Patient is in no signs of distress nor toxic appearing.  Vital signs are stable.  Low suspicion for pneumonia, pneumothorax or bronchitis and therefore will defer imaging.  Strep testing negative.May use additional over-the-counter medications as needed for supportive care.  May follow-up with urgent care as needed if symptoms persist or  worsen.  Note given.   Final Clinical Impressions(s) / UC Diagnoses   Final diagnoses:  None   Discharge Instructions   None    ED Prescriptions   None    PDMP not reviewed this encounter.   Valinda Hoar, NP 05/09/23 1102

## 2023-05-09 NOTE — ED Triage Notes (Signed)
Pt presents with a cough, nasal congestion, fever, bodyaches and sore throat since yesterday.

## 2023-05-09 NOTE — Telephone Encounter (Signed)
Reported strep test results.  Recommended supportive care with follow-up if symptoms persist or worsen

## 2023-05-14 ENCOUNTER — Ambulatory Visit
Admission: EM | Admit: 2023-05-14 | Discharge: 2023-05-14 | Disposition: A | Discharge status: Home / Self Care | Payer: Self-pay | Attending: Internal Medicine | Admitting: Internal Medicine

## 2023-05-14 DIAGNOSIS — J0140 Acute pansinusitis, unspecified: Secondary | ICD-10-CM

## 2023-05-14 MED ORDER — AMOXICILLIN-POT CLAVULANATE 875-125 MG PO TABS: 1.0000 | ORAL_TABLET | Freq: Two times a day (BID) | ORAL | 0 refills | Status: DC

## 2023-05-14 NOTE — Discharge Instructions (Signed)
You were seen today for sinus symptoms.  We are treating you for sinus infection.  I have sent in antibiotics for you to take twice daily for the next 10 days even if your symptoms start to improve earlier than that.  I also recommend you take Zyrtec and Flonase OTC for the next week.  Please follow-up if your symptoms persist or worsen.

## 2023-05-14 NOTE — ED Triage Notes (Signed)
Pt states he was seen on Monday.  Pt states he went to the beach with his family and his brothers girlfriend came in sick.  Pt states that he is having right eye discharge, nasal drainage, body aches, fatigue, cough x1week.

## 2023-05-14 NOTE — ED Triage Notes (Signed)
Pt states that his nasal drainage is light yellow but he's coughing up dark green phlegm. Pt states that his eye drainage is green in color.  Pt states that his temporal temp last night was 102.

## 2023-05-14 NOTE — ED Provider Notes (Addendum)
[facility]    CSN: [id number] Arrival date & time: 05/14/23  0803      History   Chief Complaint No chief complaint on file.   HPI Johnny Mckinney is a 37 y.o. male presents to urgent care with 1 week history of headache, sinus pressure, ear pain, nasal congestion, sore throat and cough.  The headache is located in his forehead.  He describes the pain as pressure.  He reports the ear pain is more of a fullness without drainage or loss of hearing.  He is blowing green mucus out of his nose.  He is not having any difficulty swallowing.  The cough is productive of green mucus as well.  He denies runny nose, shortness of breath, chest pain, nausea, vomiting or diarrhea.  He reports fever but denies chills or bodyaches.  He has tried Tylenol cold and flu with some relief of symptoms.  He was seen here 7/1 for the same, diagnosed with a viral URI and recommended supportive care.  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 Authorizing Provider  albuterol (VENTOLIN HFA) 108 (90 Base) MCG/ACT inhaler Inhale 2 puffs into the lungs every 4 (four) hours as needed for wheezing or shortness of breath. 11/04/20  Yes Becky Augusta, NP  amoxicillin-clavulanate (AUGMENTIN) 875-125 MG tablet Take 1 tablet by mouth 2 (two) times daily. 05/14/23  Yes Lorre Munroe, NP  benzonatate (TESSALON) 100 MG capsule Take 1-2 tablets 3 times a day as needed for cough 10/08/22  Yes Immordino, Jeannett Senior, FNP  cetirizine (ZYRTEC) 10 MG tablet Take 1 tablet (10 mg total) by mouth daily. 06/27/19  Yes Jamelle Haring, MD   fluticasone (FLONASE) 50 MCG/ACT nasal spray Place 1 spray into both nostrils daily. 06/27/19  Yes Welford Roche D, MD  lidocaine (XYLOCAINE) 5 % ointment Apply 1 Application topically 4 (four) times daily as needed for moderate pain. 05/14/22  Yes Shirlee Latch, PA-C  Spacer/Aero-Holding Chambers (AEROCHAMBER MV) inhaler Use as instructed 11/04/20  Yes Becky Augusta, NP  valACYclovir (VALTREX) 1000 MG tablet Take 1 tablet (1,000 mg total) by mouth 2 (two) times daily. 05/27/21  Yes Delton See, MD    Family History Family History  Problem Relation Age of Onset   COPD Father    Emphysema Father     Social History Social History   Tobacco Use   Smoking status: Never   Smokeless tobacco: Never  Vaping Use   Vaping Use: Never used  Substance Use Topics   Alcohol use: Not Currently   Drug use: Never     Allergies   Patient has no known allergies.   Review of Systems Review of Systems  Constitutional:  Positive for fatigue and fever. Negative for chills and diaphoresis.  HENT:  Positive for congestion, ear pain, postnasal drip, sinus pressure, sinus pain and sore throat. Negative for ear discharge, rhinorrhea and trouble swallowing.   Eyes:  Positive for pain, discharge, redness and itching.  Respiratory:  Positive for cough.  Negative for chest tightness, shortness of breath and wheezing.   Cardiovascular:  Negative for chest pain.  Gastrointestinal:  Negative for diarrhea, nausea and vomiting.  Musculoskeletal:  Negative for arthralgias and myalgias.  Skin:  Negative for rash.  Neurological:  Positive for headaches. Negative for dizziness and light-headedness.     Physical Exam Triage Vital Signs ED Triage Vitals 05/14/23 0814  Enc Vitals Group     BP (!) 154/111     Pulse Rate 84     Resp      Temp 98.5 F (36.9 C)     Temp Source Oral     SpO2 96 %     Weight 162 lb (73.5 kg)     Height 5\' 9"  (1.753 m)     Head Circumference      Peak Flow       Pain Score 10     Pain Loc      Pain Edu?      Excl. in GC?    No data found.  Updated Vital Signs BP (!) 154/111 (BP Location: Left Arm)   Pulse 84   Temp 98.5 F (36.9 C) (Oral)   Ht 5\' 9"  (1.753 m)   Wt 162 lb (73.5 kg)   SpO2 96%   BMI 23.92 kg/m      Physical Exam Constitutional:      Appearance: He is ill-appearing.  HENT:     Head: Normocephalic.     Comments: Frontal and maxillary sinus pressure noted    Right Ear: Tympanic membrane, ear canal and external ear normal.     Left Ear: Tympanic membrane, ear canal and external ear normal.     Nose: Congestion present.     Mouth/Throat:     Mouth: Mucous membranes are moist.     Pharynx: Oropharynx is clear. No oropharyngeal exudate or posterior oropharyngeal erythema.     Comments: Tonsils 2+ bilaterally Eyes:     Extraocular Movements: Extraocular movements intact.     Conjunctiva/sclera: Conjunctivae normal.     Pupils: Pupils are equal, round, and reactive to light.  Cardiovascular:     Rate and Rhythm: Normal rate and regular rhythm.     Heart sounds: Normal heart sounds.  Pulmonary:     Effort: Pulmonary effort is normal.     Breath sounds: Normal breath sounds. No wheezing, rhonchi or rales.  Lymphadenopathy:     Cervical: No cervical adenopathy.  Skin:    General: Skin is warm and dry.     Findings: No rash.  Neurological:     Mental Status: He is alert and oriented to person, place, and time.      UC Treatments / Results  Labs  EKG   Radiology   Medications Ordered in UC Medications - No data to display  Initial Impression / Assessment and Plan / UC Course  I have reviewed the triage vital signs and the nursing notes.  Pertinent labs & imaging results that were available during my care of the patient were reviewed by me and considered in my medical decision making (see chart for details).     37 year old male with 1 week history of sinus symptoms.  Seen 1 week ago with negative strep  and COVID test.  No indication for repeat testing at this time.  Lung exam benign so we will hold off on chest x-ray.  Exam consistent with acute bacterial pansinusitis.  Will treat with Augmentin 875-125 mg twice daily x 10  days.  Recommend Zyrtec and Flonase OTC for the next week.  He reports he does not need a work note.  Return precautions discussed.  Final Clinical Impressions(s) / UC Diagnoses   Final diagnoses:  Acute non-recurrent pansinusitis     Discharge Instructions      You were seen today for sinus symptoms.  We are treating you for sinus infection.  I have sent in antibiotics for you to take twice daily for the next 10 days even if your symptoms start to improve earlier than that.  I also recommend you take Zyrtec and Flonase OTC for the next week.  Please follow-up if your symptoms persist or worsen.     ED Prescriptions     Medication Sig Dispense Auth. Provider   amoxicillin-clavulanate (AUGMENTIN) 875-125 MG tablet Take 1 tablet by mouth 2 (two) times daily. 20 tablet Lorre Munroe, NP      PDMP not reviewed this encounter.   Lorre Munroe, NP 05/14/23 0827    Lorre Munroe, NP 05/14/23 [phone]

## 2023-08-08 ENCOUNTER — Ambulatory Visit
Admission: RE | Admit: 2023-08-08 | Discharge: 2023-08-08 | Disposition: A | Discharge status: Home / Self Care | Payer: Self-pay | Source: Ambulatory Visit | Attending: Emergency Medicine | Admitting: Emergency Medicine

## 2023-08-08 ENCOUNTER — Ambulatory Visit (INDEPENDENT_AMBULATORY_CARE_PROVIDER_SITE_OTHER): Payer: Self-pay

## 2023-08-08 VITALS — BP 154/113 | HR 80 | Temp 98.7°F | Resp 18 | Wt 165.0 lb

## 2023-08-08 DIAGNOSIS — R1012 Left upper quadrant pain: Secondary | ICD-10-CM | POA: Insufficient documentation

## 2023-08-08 DIAGNOSIS — J019 Acute sinusitis, unspecified: Secondary | ICD-10-CM | POA: Insufficient documentation

## 2023-08-08 DIAGNOSIS — J069 Acute upper respiratory infection, unspecified: Secondary | ICD-10-CM

## 2023-08-08 DIAGNOSIS — I10 Essential (primary) hypertension: Secondary | ICD-10-CM | POA: Insufficient documentation

## 2023-08-08 DIAGNOSIS — R109 Unspecified abdominal pain: Secondary | ICD-10-CM | POA: Insufficient documentation

## 2023-08-08 DIAGNOSIS — Z1152 Encounter for screening for COVID-19: Secondary | ICD-10-CM | POA: Insufficient documentation

## 2023-08-08 DIAGNOSIS — B9689 Other specified bacterial agents as the cause of diseases classified elsewhere: Secondary | ICD-10-CM | POA: Insufficient documentation

## 2023-08-08 LAB — RESP PANEL BY RT-PCR (FLU A&B, COVID) ARPGX2
Influenza A by PCR: NEGATIVE
Influenza B by PCR: NEGATIVE
SARS Coronavirus 2 by RT PCR: NEGATIVE

## 2023-08-08 LAB — URINALYSIS, ROUTINE W REFLEX MICROSCOPIC
Bilirubin Urine: NEGATIVE
Glucose, UA: NEGATIVE mg/dL
Hgb urine dipstick: NEGATIVE
Ketones, ur: NEGATIVE mg/dL
Leukocytes,Ua: NEGATIVE
Nitrite: NEGATIVE
Protein, ur: NEGATIVE mg/dL
Specific Gravity, Urine: 1.015 (ref 1.005–1.030)
pH: 7 (ref 5.0–8.0)

## 2023-08-08 LAB — SARS CORONAVIRUS 2 BY RT PCR: SARS Coronavirus 2 by RT PCR: NEGATIVE

## 2023-08-08 MED ORDER — FLUTICASONE PROPIONATE 50 MCG/ACT NA SUSP: 2.0000 | Freq: Every day | NASAL | 0 refills | Status: AC

## 2023-08-08 MED ORDER — AMOXICILLIN-POT CLAVULANATE 875-125 MG PO TABS: 1.0000 | ORAL_TABLET | Freq: Two times a day (BID) | ORAL | 0 refills | Status: DC

## 2023-08-08 MED ORDER — LIDOCAINE 5 % EX PTCH: 1.0000 | MEDICATED_PATCH | CUTANEOUS | 0 refills | Status: DC

## 2023-08-08 MED ORDER — NAPROXEN 500 MG PO TABS: 500.0000 mg | ORAL_TABLET | Freq: Two times a day (BID) | ORAL | 0 refills | Status: DC

## 2023-08-08 NOTE — ED Provider Notes (Signed)
HPI  SUBJECTIVE:  Johnny Mckinney is a 37 y.o. male who presents with 3 days of constant left upper quadrant pain that started in his back and has now moved to the front.  He describes the area as extremely sensitive to the touch, achy, with pins-and-needles like sensation.  No rash in this area.  He had diarrhea the first day, but this has resolved.  No nausea, vomiting, urinary complaints, other abdominal pain.  He has tried Tylenol without improvement in his pain.  Symptoms are worse when he wears clothing and with palpation.  He also reports 3 days of sore throat, head pressure, body aches, intermittent rhinorrhea, sinus pain and pressure, upper dental pain, bilateral ear pain/pressure worse on the right, postnasal drip, occasional dry cough.  No fevers, nasal congestion, change in hearing, otorrhea, wheezing, shortness of breath.  He has tried Tylenol severe cold and flu with some improvement in his symptoms.  His symptoms are worse when he bends forward or when he turns his head.  He has a past medical history of varicella, COVID in February 24, hypertension, has not taken medications for this in 15 years, UTI, nonobstructing nephrolithiasis.  [facility].  Past Medical History:  Diagnosis Date   Chlorine gas exposure    Sinusitis     Past Surgical History:  Procedure Laterality Date   UPPER GI ENDOSCOPY     wisdom teex removed Bilateral     Family History  Problem Relation Age of Onset   COPD Father    Emphysema Father     Social History   Tobacco Use   Smoking status: Never   Smokeless tobacco: Never  Vaping Use   Vaping status: Never Used  Substance Use Topics   Alcohol use: Not Currently   Drug use: Never    No current facility-administered medications for this encounter.  Current Outpatient Medications:    amoxicillin-clavulanate (AUGMENTIN) 875-125 MG tablet, Take 1 tablet by mouth every 12 (twelve) hours., Disp: 14 tablet, Rfl: 0   fluticasone (FLONASE)  50 MCG/ACT nasal spray, Place 2 sprays into both nostrils daily., Disp: 16 g, Rfl: 0   lidocaine (LIDODERM) 5 %, Place 1 patch onto the skin daily. Remove & Discard patch within 12 hours or as directed by MD, Disp: 6 patch, Rfl: 0   naproxen (NAPROSYN) 500 MG tablet, Take 1 tablet (500 mg total) by mouth 2 (two) times daily., Disp: 20 tablet, Rfl: 0   albuterol (VENTOLIN HFA) 108 (90 Base) MCG/ACT inhaler, Inhale 2 puffs into the lungs every 4 (four) hours as needed for wheezing or shortness of breath., Disp: 18 g, Rfl: 0   Spacer/Aero-Holding Chambers (AEROCHAMBER MV) inhaler, Use as instructed, Disp: 1 each, Rfl: 2  No Known Allergies   ROS  As noted in HPI.   Physical Exam  BP (!) 154/113 (BP Location: Left Arm)   Pulse 80   Temp 98.7 F (37.1 C) (Oral)   Resp 18   Wt 74.8 kg   SpO2 100%   BMI 24.37 kg/m  BP Readings from Last 3 Encounters:  08/08/23 (!) 154/113  05/14/23 (!) 154/111  05/09/23 (!) 155/90    Constitutional: Well developed, well nourished, no acute distress Eyes:  EOMI, conjunctiva normal bilaterally HENT: Normocephalic, atraumatic,mucus membranes moist.  Purulent nasal congestion.  Erythematous, swollen turbinates.  Positive maxillary sinusitis.  No frontal sinus tenderness.  TMs normal bilaterally.  No air-fluid levels behind TMs.  Enlarged tonsils without exudates.  Slight erythema.  Uvula midline. Neck: No cervical lymphadenopathy Respiratory: Normal inspiratory effort, lungs clear bilaterally, good air movement Cardiovascular: Normal rate, regular rhythm, no murmurs rubs or gallops GI: nondistended, soft.  Positive left flank tenderness.  No suprapubic tenderness.  No left upper quadrant tenderness. Back: No L-spine tenderness.  No CVAT.  No paralumbar tenderness.  Positive tenderness and increased sensitivity in the T10/T11 dermatome.  No rash. skin: No rash in the area of back pain, skin intact Musculoskeletal: no deformities Neurologic: Alert &  oriented x 3, no focal neuro deficits Psychiatric: Speech and behavior appropriate   ED Course   Medications - No data to display  Orders Placed This Encounter  Procedures   SARS Coronavirus 2 by RT PCR (hospital order, performed in [facility] hospital lab) *cepheid single result test* Anterior Nasal Swab    Standing Status:   Standing    Number of Occurrences:   1    Order Specific Question:   Patient immune status    Answer:   Normal    Order Specific Question:   Release to patient    Answer:   Immediate [1]   Resp Panel by RT-PCR (Flu A&B, Covid) Anterior Nasal Swab    Standing Status:   Standing    Number of Occurrences:   1   DG Abd 1 View    Standing Status:   Standing    Number of Occurrences:   1    Order Specific Question:   Reason for Exam (SYMPTOM  OR DIAGNOSIS REQUIRED)    Answer:   Left flank pain.  Rule out left-sided nephrolithiasis   Urinalysis, Routine w reflex microscopic -Urine, Clean Catch    Standing Status:   Standing    Number of Occurrences:   1    Order Specific Question:   Specimen Source    Answer:   Urine, Clean Catch [76]   Droplet precaution    Standing Status:   Standing    Number of Occurrences:   1    Results for orders placed or performed during the hospital encounter of 08/08/23 (from the past 24 hour(s))  SARS Coronavirus 2 by RT PCR (hospital order, performed in [facility] hospital lab) *cepheid single result test* Anterior Nasal Swab     Status: None   Collection Time: 08/08/23  7:20 PM   Specimen: Anterior Nasal Swab  Result Value Ref Range   SARS Coronavirus 2 by RT PCR NEGATIVE NEGATIVE  Resp Panel by RT-PCR (Flu A&B, Covid) Anterior Nasal Swab     Status: None   Collection Time: 08/08/23  7:36 PM   Specimen: Anterior Nasal Swab  Result Value Ref Range   SARS Coronavirus 2 by RT PCR NEGATIVE NEGATIVE   Influenza A by PCR NEGATIVE NEGATIVE   Influenza B by PCR NEGATIVE NEGATIVE  Urinalysis, Routine w reflex microscopic -Urine,  Clean Catch     Status: None   Collection Time: 08/08/23  8:03 PM  Result Value Ref Range   Color, Urine YELLOW YELLOW   APPearance CLEAR CLEAR   Specific Gravity, Urine 1.015 1.005 - 1.030   pH 7.0 5.0 - 8.0   Glucose, UA NEGATIVE NEGATIVE mg/dL   Hgb urine dipstick NEGATIVE NEGATIVE   Bilirubin Urine NEGATIVE NEGATIVE   Ketones, ur NEGATIVE NEGATIVE mg/dL   Protein, ur NEGATIVE NEGATIVE mg/dL   Nitrite NEGATIVE NEGATIVE   Leukocytes,Ua NEGATIVE NEGATIVE   DG Abd 1 View  Result Date: 08/08/2023 CLINICAL DATA:  Flank pain. EXAM: ABDOMEN -  1 VIEW COMPARISON:  None Available. FINDINGS: The bowel gas pattern is normal. No radio-opaque calculi or other significant radiographic abnormality are seen. IMPRESSION: Negative. Electronically Signed   By: Layla Maw M.D.   On: 08/08/2023 20:17    ED Clinical Impression  1. Acute bacterial sinusitis   2. Abdominal pain, unspecified abdominal location   3. Elevated blood pressure reading with diagnosis of hypertension      ED Assessment/Plan     1.  Sore throat, body aches, pain and pressure. COVID, flu negative.  Presentation consistent with an acute bacterial sinusitis.  He qualifies for Augmentin due to severe symptoms of upper dental pain.  Will send home with Mucinex, Flonase, Augmentin, Naprosyn/Tylenol.    2.  Left back, flank pain/sensitivity.  No evidence of shingles at this time.  Discussed with patient that is nearly impossible to make the diagnosis of shingles without a rash.  Also in the differential is nephrolithiasis given his history.  Will check UA and KUB.  Reviewed imaging independently.  No radioopaque stone.  See radiology report for full details.  UA negative for UTI, hematuria.  Suspect incoming shingles versus very small nonobstructing stone not seen on KUB..  Naprosyn/Tylenol, Lidoderm patch for now.  If a rash develops, he will call here and we can call in a prescription of Valtrex for him.  3.  Elevated  blood pressure with diagnosis of hypertension.  He has not taken blood pressure medication in 15 years.  He is otherwise asymptomatic today.  He has no historical evidence of end organ damage.he does not measure his blood pressure at home. pt denies any CNS type sx such as severe HA, visual changes, focal paresis, or new onset seizure activity. Pt denies any CV sx such as CP, dyspnea, palpitations, pedal edema, tearing pain radiating to back or abd. Pt denied any renal sx such as anuria or hematuria. Pt has been using OTC decongestants recently.  Will have him keep a log of his blood pressure and follow-up with his PCP.  Hypertensive emergency return precautions given.     Discussed labs, imaging, MDM, treatment plan, and plan for follow-up with patient. Discussed sn/sx that should prompt return to the ED. patient agrees with plan.   Meds ordered this encounter  Medications   fluticasone (FLONASE) 50 MCG/ACT nasal spray    Sig: Place 2 sprays into both nostrils daily.    Dispense:  16 g    Refill:  0   amoxicillin-clavulanate (AUGMENTIN) 875-125 MG tablet    Sig: Take 1 tablet by mouth every 12 (twelve) hours.    Dispense:  14 tablet    Refill:  0   naproxen (NAPROSYN) 500 MG tablet    Sig: Take 1 tablet (500 mg total) by mouth 2 (two) times daily.    Dispense:  20 tablet    Refill:  0   lidocaine (LIDODERM) 5 %    Sig: Place 1 patch onto the skin daily. Remove & Discard patch within 12 hours or as directed by MD    Dispense:  6 patch    Refill:  0      *This clinic note was created using Dragon dictation software. Therefore, there may be occasional mistakes despite careful proofreading.  ?    Domenick Gong, MD 08/09/23 1651

## 2023-08-08 NOTE — ED Triage Notes (Signed)
Sx x  3 days. Patient is sore on the left side abdomin and back. Skin feels achy. Sore throat, dizziness. Head pressure.

## 2023-08-08 NOTE — Discharge Instructions (Addendum)
I am treating you for bacterial sinus infection.  You do not have an inner ear infection.  Finish the Augmentin, if you feel better.  Take plain Mucinex, start Flonase, take Naprosyn with 1000 mg of Tylenol twice a day.  Start saline nasal irrigation with a NeilMed sinus rinse and distilled water as often as you want.  Your COVID and flu were negative.  Your urinalysis is negative for UTI or blood.  There is no stone visualized on your abdominal x-ray.  However, not all stones show up on x-ray.  I suspect that this is incoming shingles, but it is impossible to make the diagnosis without the rash.  Call here if a rash shows up the area of pain and we will call in some Valtrex.  Decrease your salt intake. diet and exercise will lower your blood pressure significantly. It is important to keep your blood pressure under good control, as having a elevated blood pressure for prolonged periods of time significantly increases your risk of stroke, heart attacks, kidney damage, eye damage, and other problems. Get a validated blood pressure cuff that goes on your arm, not your wrist.  Measure your blood pressure once a day, preferably at the same time every day. Keep a log of this and bring it to your next doctor's appointment.  Bring your blood pressure cuff as well.  Return here in 2 weeks for blood pressure recheck if you're unable to find a primary care physician by then. Return immediately to the ER if you start having chest pain, headache, problems seeing, problems talking, problems walking, if you feel like you're about to pass out, if you do pass out, if you have a seizure, or for any other concerns.  Go to www.goodrx.com  or www.costplusdrugs.com to look up your medications. This will give you a list of where you can find your prescriptions at the most affordable prices. Or ask the pharmacist what the cash price is, or if they have any other discount programs available to help make your medication more  affordable. This can be less expensive than what you would pay with insurance.

## 2023-08-11 ENCOUNTER — Emergency Department
Admission: EM | Admit: 2023-08-11 | Discharge: 2023-08-11 | Disposition: A | Discharge status: Home / Self Care | Payer: Self-pay | Attending: Student in an Organized Health Care Education/Training Program | Admitting: Student in an Organized Health Care Education/Training Program

## 2023-08-11 ENCOUNTER — Other Ambulatory Visit: Payer: Self-pay

## 2023-08-11 ENCOUNTER — Ambulatory Visit: Payer: Self-pay

## 2023-08-11 ENCOUNTER — Encounter: Payer: Self-pay | Admitting: *Deleted

## 2023-08-11 ENCOUNTER — Emergency Department: Payer: Self-pay

## 2023-08-11 DIAGNOSIS — R109 Unspecified abdominal pain: Secondary | ICD-10-CM

## 2023-08-11 DIAGNOSIS — R1012 Left upper quadrant pain: Secondary | ICD-10-CM | POA: Insufficient documentation

## 2023-08-11 LAB — URINALYSIS, ROUTINE W REFLEX MICROSCOPIC
Bilirubin Urine: NEGATIVE
Glucose, UA: NEGATIVE mg/dL
Hgb urine dipstick: NEGATIVE
Ketones, ur: NEGATIVE mg/dL
Leukocytes,Ua: NEGATIVE
Nitrite: NEGATIVE
Protein, ur: NEGATIVE mg/dL
Specific Gravity, Urine: 1.015 (ref 1.005–1.030)
pH: 5 (ref 5.0–8.0)

## 2023-08-11 LAB — CBC
HCT: 38.5 % — ABNORMAL LOW (ref 39.0–52.0)
Hemoglobin: 13.6 g/dL (ref 13.0–17.0)
MCH: 31.6 pg (ref 26.0–34.0)
MCHC: 35.3 g/dL (ref 30.0–36.0)
MCV: 89.3 fL (ref 80.0–100.0)
Platelets: 204 10*3/uL (ref 150–400)
RBC: 4.31 MIL/uL (ref 4.22–5.81)
RDW: 12.2 % (ref 11.5–15.5)
WBC: 6 10*3/uL (ref 4.0–10.5)
nRBC: 0 % (ref 0.0–0.2)

## 2023-08-11 LAB — COMPREHENSIVE METABOLIC PANEL
ALT: 18 U/L (ref 0–44)
AST: 20 U/L (ref 15–41)
Albumin: 4.5 g/dL (ref 3.5–5.0)
Alkaline Phosphatase: 74 U/L (ref 38–126)
Anion gap: 12 (ref 5–15)
BUN: 21 mg/dL — ABNORMAL HIGH (ref 6–20)
CO2: 24 mmol/L (ref 22–32)
Calcium: 9.6 mg/dL (ref 8.9–10.3)
Chloride: 101 mmol/L (ref 98–111)
Creatinine, Ser: 1.16 mg/dL (ref 0.61–1.24)
GFR, Estimated: >60 mL/min (ref >60)
Glucose, Bld: 91 mg/dL (ref 70–99)
Potassium: 3.7 mmol/L (ref 3.5–5.1)
Sodium: 137 mmol/L (ref 135–145)
Total Bilirubin: 0.9 mg/dL (ref 0.3–1.2)
Total Protein: 7.8 g/dL (ref 6.5–8.1)

## 2023-08-11 LAB — LIPASE, BLOOD: Lipase: 44 U/L (ref 11–51)

## 2023-08-11 NOTE — ED Provider Notes (Signed)
[facility] Provider Note    Event Date/Time   First MD Initiated Contact with Patient 08/11/23 1640     (approximate)   History   Flank Pain   HPI  Johnny Mckinney is a 37 y.o. male who presents to the ER for evaluation of left sided flank and upper quadrant abdominal pain.  Was seen in urgent care few days ago was placed on antibiotics for sinus infection told that he may have kidney stones possible kidney infection.  Does not feel like he is having any improvement.  Denies any rashes.  No fevers or chills.     Physical Exam   Triage Vital Signs: ED Triage Vitals  Encounter Vitals Group     BP 08/11/23 1546 (!) 165/94     Systolic BP Percentile --      Diastolic BP Percentile --      Pulse Rate 08/11/23 1546 78     Resp 08/11/23 1546 18     Temp 08/11/23 1546 98.4 F (36.9 C)     Temp Source 08/11/23 1546 Oral     SpO2 08/11/23 1546 96 %     Weight 08/11/23 1544 165 lb (74.8 kg)     Height 08/11/23 1544 5\' 9"  (1.753 m)     Head Circumference --      Peak Flow --      Pain Score 08/11/23 1543 8     Pain Loc --      Pain Education --      Exclude from Growth Chart --     Most recent vital signs: Vitals:   08/11/23 1546  BP: (!) 165/94  Pulse: 78  Resp: 18  Temp: 98.4 F (36.9 C)  SpO2: 96%     Constitutional: Alert  Eyes: Conjunctivae are normal.  Head: Atraumatic. Nose: No congestion/rhinnorhea. Mouth/Throat: Mucous membranes are moist.   Neck: Painless ROM.  Cardiovascular:   Good peripheral circulation. Respiratory: Normal respiratory effort.  No retractions.  Gastrointestinal: Soft with mild tenderness to palpation in the left upper quadrant no guarding or rebound. Musculoskeletal:  no deformity Neurologic:  MAE spontaneously. No gross focal neurologic deficits are appreciated.  Skin:  Skin is warm, dry and intact. No rash noted. Psychiatric: Mood and affect are normal. Speech and behavior are normal.    ED Results /  Procedures / Treatments   Labs (all labs ordered are listed, but only abnormal results are displayed) Labs Reviewed  COMPREHENSIVE METABOLIC PANEL - Abnormal; Notable for the following components:      Result Value   BUN 21 (*)    All other components within normal limits  CBC - Abnormal; Notable for the following components:   HCT 38.5 (*)    All other components within normal limits  URINALYSIS, ROUTINE W REFLEX MICROSCOPIC - Abnormal; Notable for the following components:   Color, Urine YELLOW (*)    APPearance CLEAR (*)    All other components within normal limits  LIPASE, BLOOD     EKG     RADIOLOGY Please see ED Course for my review and interpretation.  I personally reviewed all radiographic images ordered to evaluate for the above acute complaints and reviewed radiology reports and findings.  These findings were personally discussed with the patient.  Please see medical record for radiology report.    PROCEDURES:  Critical Care performed: No  Procedures   MEDICATIONS ORDERED IN ED: Medications - No data to display   IMPRESSION /  MDM / ASSESSMENT AND PLAN / ED COURSE  I reviewed the triage vital signs and the nursing notes.                              Differential diagnosis includes, but is not limited to, stone, pancreatitis, colitis, gastritis, enteritis  Patient presenting to the ER for evaluation of symptoms as described above.  Based on symptoms, risk factors and considered above differential, this presenting complaint could reflect a potentially life-threatening illness therefore the patient will be placed on continuous pulse oximetry and telemetry for monitoring.  Laboratory evaluation will be sent to evaluate for the above complaints.      Clinical Course as of 08/11/23 1949  Thu Aug 11, 2023  6962 Patient reassessed.  Imaging is reassuring.  Discussed finding of tiny renal stone.  Patient otherwise well-appearing in no acute distress does appear  appropriate for outpatient follow-up. [PR]    Clinical Course User Index [PR] Willy Eddy, MD     FINAL CLINICAL IMPRESSION(S) / ED DIAGNOSES   Final diagnoses:  Flank pain     Rx / DC Orders   ED Discharge Orders     None        Note:  This document was prepared using Dragon voice recognition software and may include unintentional dictation errors.    Willy Eddy, MD 08/11/23 [phone]

## 2023-08-11 NOTE — ED Triage Notes (Signed)
Pt has left side flank pain.   Pt was seen at urgent care this week with similar sx.  Pt taking abx.  Pt states pain is not any better.  Pt alert.

## 2023-09-16 ENCOUNTER — Ambulatory Visit
Admission: RE | Admit: 2023-09-16 | Discharge: 2023-09-16 | Disposition: A | Discharge status: Home / Self Care | Payer: Self-pay | Source: Ambulatory Visit | Attending: Physician Assistant | Admitting: Physician Assistant

## 2023-09-16 ENCOUNTER — Ambulatory Visit: Payer: Self-pay

## 2023-09-16 VITALS — BP 151/82 | HR 72 | Temp 98.8°F | Resp 15 | Ht 69.0 in | Wt 164.9 lb

## 2023-09-16 DIAGNOSIS — J069 Acute upper respiratory infection, unspecified: Secondary | ICD-10-CM

## 2023-09-16 LAB — GROUP A STREP BY PCR: Group A Strep by PCR: NOT DETECTED

## 2023-09-16 MED ORDER — PROMETHAZINE-DM 6.25-15 MG/5ML PO SYRP: 5.0000 mL | ORAL_SOLUTION | Freq: Four times a day (QID) | ORAL | 0 refills | Status: DC | PRN

## 2023-09-16 NOTE — ED Provider Notes (Signed)
Johnny Mckinney    CSN: [id number] Arrival date & time: 09/16/23  0907      History   Chief Complaint Chief Complaint  Patient presents with   Cough    Appointment    HPI Johnny Mckinney is a 36 y.o. male.   37 year old male pt, Johnny Mckinney, presents to urgent care for evaluation of cough, sore throat, body aches, chills that started 5 days ago, feels like it is moving into his chest.  Patient reports fever off and on has been taken over-the-counter DayQuil/ NyQuil as label directed.  No known illness exposure, patient states he works at [employer].  The history is provided by the patient. No language interpreter was used.    Past Medical History:  Diagnosis Date   Chlorine gas exposure    Sinusitis     Patient Active Problem List   Diagnosis Date Noted   URI with cough and congestion 09/16/2023   Headache 09/26/2019   Hemorrhoids 09/26/2019   Low back pain 03/15/2014    Past Surgical History:  Procedure Laterality Date   UPPER GI ENDOSCOPY     wisdom teex removed Bilateral        Home Medications    Prior to Admission medications   Medication Sig Start Date End Date Taking? Authorizing Provider  fluticasone (FLONASE) 50 MCG/ACT nasal spray Place 2 sprays into both nostrils daily. 08/08/23  Yes Domenick Gong, MD  promethazine-dextromethorphan (PROMETHAZINE-DM) 6.25-15 MG/5ML syrup Take 5 mLs by mouth 4 (four) times daily as needed for cough. 09/16/23  Yes Naveed Humphres, Para March, NP  albuterol (VENTOLIN HFA) 108 (90 Base) MCG/ACT inhaler Inhale 2 puffs into the lungs every 4 (four) hours as needed for wheezing or shortness of breath. 11/04/20   Becky Augusta, NP  amoxicillin-clavulanate (AUGMENTIN) 875-125 MG tablet Take 1 tablet by mouth every 12 (twelve) hours. 08/08/23   Domenick Gong, MD  lidocaine (LIDODERM) 5 % Place 1 patch onto the skin daily. Remove & Discard patch within 12 hours or as directed by MD 08/08/23   Domenick Gong, MD  naproxen  (NAPROSYN) 500 MG tablet Take 1 tablet (500 mg total) by mouth 2 (two) times daily. 08/08/23   Domenick Gong, MD  Spacer/Aero-Holding Chambers (AEROCHAMBER MV) inhaler Use as instructed 11/04/20   Becky Augusta, NP    Family History Family History  Problem Relation Age of Onset   COPD Father    Emphysema Father     Social History Social History   Tobacco Use   Smoking status: Never   Smokeless tobacco: Never  Vaping Use   Vaping status: Never Used  Substance Use Topics   Alcohol use: Not Currently   Drug use: Never     Allergies   Patient has no known allergies.   Review of Systems Review of Systems  Constitutional:  Positive for chills and fever.  HENT:  Positive for sore throat.   Respiratory:  Positive for cough.   Musculoskeletal:  Positive for myalgias.  All other systems reviewed and are negative.    Physical Exam Triage Vital Signs ED Triage Vitals  Encounter Vitals Group     BP 09/16/23 0926 (!) 151/82     Systolic BP Percentile --      Diastolic BP Percentile --      Pulse Rate 09/16/23 0926 72     Resp 09/16/23 0926 15     Temp 09/16/23 0926 98.8 F (37.1 C)     Temp Source 09/16/23 0926  Oral     SpO2 09/16/23 0926 97 %     Weight 09/16/23 0924 164 lb 14.5 oz (74.8 kg)     Height 09/16/23 0924 5\' 9"  (1.753 m)     Head Circumference --      Peak Flow --      Pain Score 09/16/23 0924 9     Pain Loc --      Pain Education --      Exclude from Growth Chart --    No data found.  Updated Vital Signs BP (!) 151/82 (BP Location: Right Arm)   Pulse 72   Temp 98.8 F (37.1 C) (Oral)   Resp 15   Ht 5\' 9"  (1.753 m)   Wt 164 lb 14.5 oz (74.8 kg)   SpO2 97%   BMI 24.35 kg/m   Visual Acuity Right Eye Distance:   Left Eye Distance:   Bilateral Distance:    Right Eye Near:   Left Eye Near:    Bilateral Near:     Physical Exam Vitals and nursing note reviewed.  Constitutional:      Appearance: Normal appearance. He is well-developed and  well-groomed. He is not ill-appearing.  HENT:     Head: Normocephalic.     Right Ear: Tympanic membrane is retracted.     Left Ear: Tympanic membrane is retracted.     Nose: Nose normal.     Mouth/Throat:     Lips: Pink.     Mouth: Mucous membranes are moist.     Pharynx: Oropharynx is clear. Uvula midline.     Tonsils: No tonsillar exudate or tonsillar abscesses.  Cardiovascular:     Rate and Rhythm: Normal rate and regular rhythm.     Pulses: Normal pulses.     Heart sounds: Normal heart sounds.  Pulmonary:     Effort: Pulmonary effort is normal.     Breath sounds: Normal breath sounds and air entry.  Neurological:     General: No focal deficit present.     Mental Status: He is alert and oriented to person, place, and time.     GCS: GCS eye subscore is 4. GCS verbal subscore is 5. GCS motor subscore is 6.     Cranial Nerves: No cranial nerve deficit.     Sensory: No sensory deficit.  Psychiatric:        Attention and Perception: Attention normal.        Mood and Affect: Mood normal.        Speech: Speech normal.        Behavior: Behavior normal. Behavior is cooperative.      UC Treatments / Results  Labs (all labs ordered are listed, but only abnormal results are displayed) Labs Reviewed  GROUP A STREP BY PCR    EKG   Radiology DG Chest 2 View  Result Date: 09/16/2023 CLINICAL DATA:  Cough, sore throat, body aches EXAM: CHEST - 2 VIEW COMPARISON:  08/01/2009 FINDINGS: Cardiac and mediastinal contours are within normal limits. No focal pulmonary opacity. No pleural effusion or pneumothorax. No acute osseous abnormality. IMPRESSION: No acute cardiopulmonary process. Electronically Signed   By: Wiliam Ke M.D.   On: 09/16/2023 11:18    Procedures Procedures (including critical care time)  Medications Ordered in UC Medications - No data to display  Initial Impression / Assessment and Plan / UC Course  I have reviewed the triage vital signs and the nursing  notes.  Pertinent labs & imaging results that  were available during my care of the patient were reviewed by me and considered in my medical decision making (see chart for details).  Clinical Course as of 09/16/23 2014  Fri Sep 16, 2023  1004 Strep negative, cxr ordered [JD]  1016 To xray  [JD]  1046 Wet read of x-ray no pneumonia noted, discussed exam findings and plan of care with patient.  Scripted cough medicine for patient aware if chest x-ray comes are positive we will call in antibiotic for pneumonia otherwise we will need to treat with over-the-counter meds, verbalized understanding this provider. [JD]    Clinical Course User Index [JD] Ly Wass, Para March, NP    Ddx: Viral URI with cough and congestion, allergies Final Clinical Impressions(s) / UC Diagnoses   Final diagnoses:  URI with cough and congestion     Discharge Instructions      Your strep test is negative. Most likely you have a viral illness: no antibiotic as indicated at this time, May treat with OTC meds of choice, take cough medicine as directed. Make sure to drink plenty of fluids to stay hydrated(gatorade, water, popsicles,jello,etc), avoid caffeine products. Return as needed.  Check my chart for results.      ED Prescriptions     Medication Sig Dispense Auth. Provider   promethazine-dextromethorphan (PROMETHAZINE-DM) 6.25-15 MG/5ML syrup Take 5 mLs by mouth 4 (four) times daily as needed for cough. 118 mL Suleman Gunning, Para March, NP      PDMP not reviewed this encounter.   Clancy Gourd, NP 09/16/23 2014

## 2023-09-16 NOTE — Discharge Instructions (Addendum)
Your strep test is negative. Most likely you have a viral illness: no antibiotic as indicated at this time, May treat with OTC meds of choice, take cough medicine as directed. Make sure to drink plenty of fluids to stay hydrated(gatorade, water, popsicles,jello,etc), avoid caffeine products. Return as needed.  Check my chart for results.

## 2023-09-16 NOTE — ED Triage Notes (Signed)
Patient c/o cough, sore throat, bodyaches and chills that started last Sunday.  Patient reports fever off and on.  Patient has been taking OTC Dayquil.

## 2023-12-05 ENCOUNTER — Ambulatory Visit
Admission: RE | Admit: 2023-12-05 | Discharge: 2023-12-05 | Disposition: A | Discharge status: Home / Self Care | Payer: Self-pay | Source: Ambulatory Visit | Attending: Physician Assistant | Admitting: Physician Assistant

## 2023-12-05 VITALS — BP 140/92 | HR 78 | Temp 97.5°F | Resp 18

## 2023-12-05 DIAGNOSIS — J209 Acute bronchitis, unspecified: Secondary | ICD-10-CM

## 2023-12-05 DIAGNOSIS — R062 Wheezing: Secondary | ICD-10-CM

## 2023-12-05 DIAGNOSIS — R051 Acute cough: Secondary | ICD-10-CM

## 2023-12-05 MED ORDER — ALBUTEROL SULFATE (2.5 MG/3ML) 0.083% IN NEBU
2.5000 mg | INHALATION_SOLUTION | Freq: Once | RESPIRATORY_TRACT | Status: AC
  Administered 2023-12-05: 2.5 mg via RESPIRATORY_TRACT

## 2023-12-05 MED ORDER — ALBUTEROL SULFATE HFA 108 (90 BASE) MCG/ACT IN AERS
1.0000 | INHALATION_SPRAY | Freq: Four times a day (QID) | RESPIRATORY_TRACT | 0 refills | Status: AC | PRN
Start: 2023-12-05 — End: ?

## 2023-12-05 MED ORDER — PSEUDOEPH-BROMPHEN-DM 30-2-10 MG/5ML PO SYRP: 10.0000 mL | ORAL_SOLUTION | Freq: Four times a day (QID) | ORAL | 0 refills | Status: AC | PRN

## 2023-12-05 MED ORDER — PREDNISONE 50 MG PO TABS: 50.0000 mg | ORAL_TABLET | Freq: Every day | ORAL | 0 refills | Status: AC

## 2023-12-05 MED ORDER — IPRATROPIUM BROMIDE 0.06 % NA SOLN: 2.0000 | Freq: Four times a day (QID) | NASAL | 0 refills | Status: AC

## 2023-12-05 MED ORDER — DOXYCYCLINE HYCLATE 100 MG PO CAPS: 100.0000 mg | ORAL_CAPSULE | Freq: Two times a day (BID) | ORAL | 0 refills | Status: AC

## 2023-12-05 NOTE — Discharge Instructions (Addendum)
-  You have bronchitis which is generally viral and can last up to 6 weeks. - I did send an antibiotic since he been ill for couple of weeks.  I also sent a nasal spray, cough medicine, corticosteroid and refilled the inhaler if he needs. - Increase rest and fluids. - You need to return if you have fever, worsening cough, increased breathing difficulty, or weakness.
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
## 2023-12-05 NOTE — ED Triage Notes (Signed)
Patient presents to UC for cough, wheezing, post-nasal drip x 2 weeks. Treating symptoms with tylenol cold/flu.   Denies fever or SOB.

## 2024-01-13 ENCOUNTER — Ambulatory Visit
Admission: RE | Admit: 2024-01-13 | Discharge: 2024-01-13 | Disposition: A | Discharge status: Home / Self Care | Payer: Self-pay | Source: Ambulatory Visit | Attending: Emergency Medicine | Admitting: Emergency Medicine

## 2024-01-13 DIAGNOSIS — S39012A Strain of muscle, fascia and tendon of lower back, initial encounter: Secondary | ICD-10-CM

## 2024-01-13 DIAGNOSIS — S76011A Strain of muscle, fascia and tendon of right hip, initial encounter: Secondary | ICD-10-CM

## 2024-01-13 DIAGNOSIS — S29019A Strain of muscle and tendon of unspecified wall of thorax, initial encounter: Secondary | ICD-10-CM

## 2024-01-13 MED ORDER — BACLOFEN 10 MG PO TABS: 10.0000 mg | ORAL_TABLET | Freq: Three times a day (TID) | ORAL | 0 refills | Status: AC

## 2024-01-13 MED ORDER — IBUPROFEN 600 MG PO TABS: 600.0000 mg | ORAL_TABLET | Freq: Four times a day (QID) | ORAL | 0 refills | Status: AC | PRN
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
## 2024-01-13 NOTE — Discharge Instructions (Addendum)

## 2024-03-15 ENCOUNTER — Ambulatory Visit
Admission: RE | Admit: 2024-03-15 | Discharge: 2024-03-15 | Disposition: A | Discharge status: Home / Self Care | Payer: Self-pay | Source: Ambulatory Visit | Attending: Family Medicine | Admitting: Family Medicine

## 2024-03-15 VITALS — BP 139/82 | HR 110 | Temp 98.9°F | Ht 69.0 in | Wt 165.0 lb

## 2024-03-15 DIAGNOSIS — J111 Influenza due to unidentified influenza virus with other respiratory manifestations: Secondary | ICD-10-CM | POA: Insufficient documentation

## 2024-03-15 LAB — RESP PANEL BY RT-PCR (FLU A&B, COVID) ARPGX2
Influenza A by PCR: NEGATIVE
Influenza B by PCR: NEGATIVE
SARS Coronavirus 2 by RT PCR: NEGATIVE

## 2024-03-15 LAB — GROUP A STREP BY PCR: Group A Strep by PCR: NOT DETECTED

## 2024-03-15 MED ORDER — IBUPROFEN 800 MG PO TABS
800.0000 mg | ORAL_TABLET | Freq: Once | ORAL | Status: AC
  Administered 2024-03-15: 800 mg via ORAL

## 2024-03-15 NOTE — Discharge Instructions (Addendum)
 Johnny Mckinney's strep, influenza and COVID are all negative. You have a viral respiratory infection that will gradually improve over the next 7-10 days. Cough may last up to 3 weeks.   If your were prescribed medication, stop by the pharmacy to pick them up.   You can take Tylenol and/or Ibuprofen  as needed for fever reduction and pain relief.    For cough: honey 1/2 to 1 teaspoon (you can dilute the honey in water or another fluid). You can also use guaifenesin  and dextromethorphan for cough. You can use a humidifier for chest congestion and cough.  If you don't have a humidifier, you can sit in the bathroom with the hot shower running.      For sore throat: try warm salt water gargles, Mucinex  sore throat cough drops or cepacol lozenges, throat spray, warm tea or water with lemon/honey, popsicles or ice, or OTC cold relief medicine for throat discomfort. You can also purchase chloraseptic spray at the pharmacy or dollar store.   For congestion: take a daily anti-histamine like Zyrtec , Claritin, and a oral decongestant, such as pseudoephedrine.  You can also use Flonase  1-2 sprays in each nostril daily. Afrin is also a good option, if you do not have high blood pressure.    It is important to stay hydrated: drink plenty of fluids (water, gatorade/powerade/pedialyte, juices, or teas) to keep your throat moisturized and help further relieve irritation/discomfort.    Return or go to the Emergency Department if symptoms worsen or do not improve in the next few days

## 2024-03-15 NOTE — ED Triage Notes (Signed)
 Patient states that he recently traveled to [place]  on Monday and woke up last night with sore throat and body aches.
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
## 2024-03-24 ENCOUNTER — Ambulatory Visit
Admission: RE | Admit: 2024-03-24 | Discharge: 2024-03-24 | Disposition: A | Discharge status: Home / Self Care | Payer: Self-pay | Source: Ambulatory Visit | Attending: Physician Assistant | Admitting: Physician Assistant

## 2024-03-24 ENCOUNTER — Ambulatory Visit (INDEPENDENT_AMBULATORY_CARE_PROVIDER_SITE_OTHER): Payer: Self-pay

## 2024-03-24 VITALS — BP 134/83 | HR 84 | Temp 98.6°F | Resp 16 | Ht 69.0 in | Wt 164.9 lb

## 2024-03-24 DIAGNOSIS — R5383 Other fatigue: Secondary | ICD-10-CM

## 2024-03-24 DIAGNOSIS — R051 Acute cough: Secondary | ICD-10-CM

## 2024-03-24 DIAGNOSIS — R52 Pain, unspecified: Secondary | ICD-10-CM

## 2024-03-24 DIAGNOSIS — J019 Acute sinusitis, unspecified: Secondary | ICD-10-CM

## 2024-03-24 MED ORDER — PREDNISONE 20 MG PO TABS: 40.0000 mg | ORAL_TABLET | Freq: Every day | ORAL | 0 refills | Status: AC

## 2024-03-24 MED ORDER — PSEUDOEPH-BROMPHEN-DM 30-2-10 MG/5ML PO SYRP: 10.0000 mL | ORAL_SOLUTION | Freq: Four times a day (QID) | ORAL | 0 refills | Status: AC | PRN

## 2024-03-24 MED ORDER — AMOXICILLIN-POT CLAVULANATE 875-125 MG PO TABS: 1.0000 | ORAL_TABLET | Freq: Two times a day (BID) | ORAL | 0 refills | Status: AC
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
## 2024-03-24 NOTE — ED Triage Notes (Signed)
 Pt was seen on 03/15/24. Covid, flu, strep all negative. Pt returns today because he is not feeling better. He states he has chills and feels warm and has had a fever intermittently. He states he now has dark thick mucus.
